# Patient Record
Sex: Female | Born: 1955 | Race: White | Hispanic: No | Smoking: Never smoker
Health system: Southern US, Community
[De-identification: ages and names within clinical notes are randomized; demographics above are authoritative.]

## PROBLEM LIST (undated history)

## (undated) DIAGNOSIS — E785 Hyperlipidemia, unspecified: Secondary | ICD-10-CM

## (undated) DIAGNOSIS — M810 Age-related osteoporosis without current pathological fracture: Secondary | ICD-10-CM

## (undated) DIAGNOSIS — M1990 Unspecified osteoarthritis, unspecified site: Secondary | ICD-10-CM

---

## 1998-05-12 ENCOUNTER — Other Ambulatory Visit: Admission: RE | Admit: 1998-05-12 | Discharge: 1998-05-12 | Payer: Self-pay | Admitting: *Deleted

## 2003-03-06 ENCOUNTER — Encounter: Admission: RE | Admit: 2003-03-06 | Discharge: 2003-03-06 | Payer: Self-pay | Admitting: Family Medicine

## 2003-12-04 ENCOUNTER — Other Ambulatory Visit: Admission: RE | Admit: 2003-12-04 | Discharge: 2003-12-04 | Payer: Self-pay | Admitting: Obstetrics and Gynecology

## 2005-03-16 ENCOUNTER — Other Ambulatory Visit: Admission: RE | Admit: 2005-03-16 | Discharge: 2005-03-16 | Payer: Self-pay | Admitting: Obstetrics and Gynecology

## 2008-01-08 ENCOUNTER — Encounter: Admission: RE | Admit: 2008-01-08 | Discharge: 2008-01-08 | Payer: Self-pay | Admitting: Obstetrics and Gynecology

## 2010-07-26 ENCOUNTER — Other Ambulatory Visit: Payer: Self-pay | Admitting: Obstetrics and Gynecology

## 2010-07-26 DIAGNOSIS — R928 Other abnormal and inconclusive findings on diagnostic imaging of breast: Secondary | ICD-10-CM

## 2010-08-03 ENCOUNTER — Ambulatory Visit
Admission: RE | Admit: 2010-08-03 | Discharge: 2010-08-03 | Disposition: A | Discharge status: Home / Self Care | Payer: BC Managed Care – PPO | Source: Ambulatory Visit | Attending: Obstetrics and Gynecology | Admitting: Obstetrics and Gynecology

## 2010-08-03 DIAGNOSIS — R928 Other abnormal and inconclusive findings on diagnostic imaging of breast: Secondary | ICD-10-CM

## 2014-05-08 ENCOUNTER — Ambulatory Visit (HOSPITAL_COMMUNITY)
Admission: RE | Admit: 2014-05-08 | Discharge: 2014-05-08 | Disposition: A | Discharge status: Home / Self Care | Payer: BC Managed Care – PPO | Source: Ambulatory Visit | Attending: Family Medicine | Admitting: Family Medicine

## 2014-05-08 ENCOUNTER — Other Ambulatory Visit (HOSPITAL_COMMUNITY): Payer: Self-pay | Admitting: Family Medicine

## 2014-05-08 DIAGNOSIS — Z780 Asymptomatic menopausal state: Secondary | ICD-10-CM | POA: Insufficient documentation

## 2014-05-08 DIAGNOSIS — Z8781 Personal history of (healed) traumatic fracture: Secondary | ICD-10-CM

## 2014-05-08 DIAGNOSIS — Z13820 Encounter for screening for osteoporosis: Secondary | ICD-10-CM | POA: Insufficient documentation

## 2014-06-09 ENCOUNTER — Other Ambulatory Visit: Payer: Self-pay | Admitting: Orthopedic Surgery

## 2014-06-10 ENCOUNTER — Encounter (HOSPITAL_COMMUNITY): Payer: Self-pay | Admitting: *Deleted

## 2014-06-10 MED ORDER — CEFAZOLIN SODIUM-DEXTROSE 2-3 GM-% IV SOLR
2.0000 g | INTRAVENOUS | Status: AC
  Administered 2014-06-11: 2 g via INTRAVENOUS
  Filled 2014-06-10: qty 50

## 2014-06-11 ENCOUNTER — Ambulatory Visit (HOSPITAL_COMMUNITY): Payer: BC Managed Care – PPO

## 2014-06-11 ENCOUNTER — Encounter (HOSPITAL_COMMUNITY)
Admission: RE | Disposition: A | Discharge status: Home / Self Care | Payer: Self-pay | Source: Ambulatory Visit | Attending: Orthopedic Surgery

## 2014-06-11 ENCOUNTER — Ambulatory Visit (HOSPITAL_COMMUNITY): Payer: BC Managed Care – PPO | Admitting: Anesthesiology

## 2014-06-11 ENCOUNTER — Ambulatory Visit (HOSPITAL_COMMUNITY)
Admission: RE | Admit: 2014-06-11 | Discharge: 2014-06-11 | Disposition: A | Discharge status: Home / Self Care | Payer: BC Managed Care – PPO | Source: Ambulatory Visit | Attending: Orthopedic Surgery | Admitting: Orthopedic Surgery

## 2014-06-11 ENCOUNTER — Encounter (HOSPITAL_COMMUNITY): Payer: Self-pay | Admitting: *Deleted

## 2014-06-11 DIAGNOSIS — M1990 Unspecified osteoarthritis, unspecified site: Secondary | ICD-10-CM | POA: Diagnosis not present

## 2014-06-11 DIAGNOSIS — M4854XA Collapsed vertebra, not elsewhere classified, thoracic region, initial encounter for fracture: Secondary | ICD-10-CM | POA: Diagnosis not present

## 2014-06-11 DIAGNOSIS — Z419 Encounter for procedure for purposes other than remedying health state, unspecified: Secondary | ICD-10-CM

## 2014-06-11 DIAGNOSIS — Z01818 Encounter for other preprocedural examination: Secondary | ICD-10-CM

## 2014-06-11 HISTORY — DX: Age-related osteoporosis without current pathological fracture: M81.0

## 2014-06-11 HISTORY — PX: KYPHOPLASTY: SHX5884

## 2014-06-11 HISTORY — DX: Unspecified osteoarthritis, unspecified site: M19.90

## 2014-06-11 LAB — CBC WITH DIFFERENTIAL/PLATELET
BASOS ABS: 0.1 10*3/uL (ref 0.0–0.1)
Basophils Relative: 1 % (ref 0–1)
EOS ABS: 0.3 10*3/uL (ref 0.0–0.7)
Eosinophils Relative: 4 % (ref 0–5)
HCT: 40.3 % (ref 36.0–46.0)
Hemoglobin: 14.1 g/dL (ref 12.0–15.0)
Lymphocytes Relative: 20 % (ref 12–46)
Lymphs Abs: 1.2 10*3/uL (ref 0.7–4.0)
MCH: 32.6 pg (ref 26.0–34.0)
MCHC: 35 g/dL (ref 30.0–36.0)
MCV: 93.1 fL (ref 78.0–100.0)
Monocytes Absolute: 0.8 10*3/uL (ref 0.1–1.0)
Monocytes Relative: 13 % — ABNORMAL HIGH (ref 3–12)
NEUTROS PCT: 62 % (ref 43–77)
Neutro Abs: 3.5 10*3/uL (ref 1.7–7.7)
Platelets: 271 10*3/uL (ref 150–400)
RBC: 4.33 MIL/uL (ref 3.87–5.11)
RDW: 11.9 % (ref 11.5–15.5)
WBC: 5.8 10*3/uL (ref 4.0–10.5)

## 2014-06-11 LAB — URINALYSIS, ROUTINE W REFLEX MICROSCOPIC
BILIRUBIN URINE: NEGATIVE
Glucose, UA: NEGATIVE mg/dL
Hgb urine dipstick: NEGATIVE
Ketones, ur: NEGATIVE mg/dL
Nitrite: NEGATIVE
Protein, ur: NEGATIVE mg/dL
SPECIFIC GRAVITY, URINE: 1.022 (ref 1.005–1.030)
UROBILINOGEN UA: 0.2 mg/dL (ref 0.0–1.0)
pH: 5 (ref 5.0–8.0)

## 2014-06-11 LAB — COMPREHENSIVE METABOLIC PANEL
ALT: 15 U/L (ref 14–54)
ANION GAP: 11 (ref 5–15)
AST: 19 U/L (ref 15–41)
Albumin: 3.9 g/dL (ref 3.5–5.0)
Alkaline Phosphatase: 68 U/L (ref 38–126)
BUN: 14 mg/dL (ref 6–20)
CHLORIDE: 101 mmol/L (ref 101–111)
CO2: 24 mmol/L (ref 22–32)
Calcium: 9.4 mg/dL (ref 8.9–10.3)
Creatinine, Ser: 0.61 mg/dL (ref 0.44–1.00)
GLUCOSE: 97 mg/dL (ref 70–99)
POTASSIUM: 4.2 mmol/L (ref 3.5–5.1)
SODIUM: 136 mmol/L (ref 135–145)
Total Bilirubin: 1 mg/dL (ref 0.3–1.2)
Total Protein: 6.8 g/dL (ref 6.5–8.1)

## 2014-06-11 LAB — URINE MICROSCOPIC-ADD ON

## 2014-06-11 LAB — PROTIME-INR
INR: 1 (ref 0.00–1.49)
PROTHROMBIN TIME: 13.3 s (ref 11.6–15.2)

## 2014-06-11 LAB — SURGICAL PCR SCREEN
MRSA, PCR: NEGATIVE
STAPHYLOCOCCUS AUREUS: NEGATIVE

## 2014-06-11 LAB — APTT: aPTT: 29 seconds (ref 24–37)

## 2014-06-11 SURGERY — KYPHOPLASTY
Anesthesia: General

## 2014-06-11 MED ORDER — IOHEXOL 300 MG/ML  SOLN
INTRAMUSCULAR | Status: DC | PRN
  Administered 2014-06-11: 10 mL

## 2014-06-11 MED ORDER — PHENYLEPHRINE 40 MCG/ML (10ML) SYRINGE FOR IV PUSH (FOR BLOOD PRESSURE SUPPORT)
PREFILLED_SYRINGE | INTRAVENOUS | Status: AC
  Filled 2014-06-11: qty 10

## 2014-06-11 MED ORDER — BUPIVACAINE-EPINEPHRINE 0.25% -1:200000 IJ SOLN: INTRAMUSCULAR | Status: DC | PRN

## 2014-06-11 MED ORDER — BACITRACIN 500 UNIT/GM EX OINT
TOPICAL_OINTMENT | CUTANEOUS | Status: DC | PRN
  Administered 2014-06-11: 1 via TOPICAL

## 2014-06-11 MED ORDER — PROPOFOL 10 MG/ML IV BOLUS
INTRAVENOUS | Status: DC | PRN
  Administered 2014-06-11: 140 mg via INTRAVENOUS

## 2014-06-11 MED ORDER — HYDROMORPHONE HCL 1 MG/ML IJ SOLN
0.2500 mg | INTRAMUSCULAR | Status: DC | PRN
  Administered 2014-06-11 (×4): 0.5 mg via INTRAVENOUS

## 2014-06-11 MED ORDER — ARTIFICIAL TEARS OP OINT
TOPICAL_OINTMENT | OPHTHALMIC | Status: DC | PRN
  Administered 2014-06-11: 1 via OPHTHALMIC

## 2014-06-11 MED ORDER — LIDOCAINE HCL (CARDIAC) 20 MG/ML IV SOLN
INTRAVENOUS | Status: DC | PRN
  Administered 2014-06-11: 100 mg via INTRAVENOUS

## 2014-06-11 MED ORDER — BUPIVACAINE-EPINEPHRINE (PF) 0.25% -1:200000 IJ SOLN
INTRAMUSCULAR | Status: AC
  Filled 2014-06-11: qty 30

## 2014-06-11 MED ORDER — NEOSTIGMINE METHYLSULFATE 10 MG/10ML IV SOLN
INTRAVENOUS | Status: AC
  Filled 2014-06-11: qty 1

## 2014-06-11 MED ORDER — HYDROMORPHONE HCL 1 MG/ML IJ SOLN
INTRAMUSCULAR | Status: AC
  Filled 2014-06-11: qty 1

## 2014-06-11 MED ORDER — MUPIROCIN 2 % EX OINT
TOPICAL_OINTMENT | CUTANEOUS | Status: AC
  Administered 2014-06-11: 1
  Filled 2014-06-11: qty 22

## 2014-06-11 MED ORDER — POVIDONE-IODINE 7.5 % EX SOLN
Freq: Once | CUTANEOUS | Status: DC
  Filled 2014-06-11: qty 118

## 2014-06-11 MED ORDER — MIDAZOLAM HCL 5 MG/5ML IJ SOLN
INTRAMUSCULAR | Status: DC | PRN
  Administered 2014-06-11: 2 mg via INTRAVENOUS

## 2014-06-11 MED ORDER — MIDAZOLAM HCL 2 MG/2ML IJ SOLN
INTRAMUSCULAR | Status: AC
  Filled 2014-06-11: qty 2

## 2014-06-11 MED ORDER — ONDANSETRON HCL 4 MG/2ML IJ SOLN
INTRAMUSCULAR | Status: AC
  Administered 2014-06-11: 4 mg via INTRAVENOUS
  Filled 2014-06-11: qty 2

## 2014-06-11 MED ORDER — PHENYLEPHRINE HCL 10 MG/ML IJ SOLN
INTRAMUSCULAR | Status: DC | PRN
  Administered 2014-06-11 (×3): 80 ug via INTRAVENOUS
  Administered 2014-06-11: 160 ug via INTRAVENOUS

## 2014-06-11 MED ORDER — LIDOCAINE HCL (CARDIAC) 20 MG/ML IV SOLN
INTRAVENOUS | Status: AC
  Filled 2014-06-11: qty 5

## 2014-06-11 MED ORDER — PROPOFOL 10 MG/ML IV BOLUS
INTRAVENOUS | Status: AC
  Filled 2014-06-11: qty 20

## 2014-06-11 MED ORDER — GLYCOPYRROLATE 0.2 MG/ML IJ SOLN
INTRAMUSCULAR | Status: AC
  Filled 2014-06-11: qty 2

## 2014-06-11 MED ORDER — ONDANSETRON HCL 4 MG/2ML IJ SOLN
INTRAMUSCULAR | Status: DC | PRN
  Administered 2014-06-11: 4 mg via INTRAVENOUS

## 2014-06-11 MED ORDER — ONDANSETRON HCL 4 MG/2ML IJ SOLN
INTRAMUSCULAR | Status: AC
  Filled 2014-06-11: qty 2

## 2014-06-11 MED ORDER — ONDANSETRON HCL 4 MG/2ML IJ SOLN
4.0000 mg | Freq: Once | INTRAMUSCULAR | Status: AC | PRN
  Administered 2014-06-11: 4 mg via INTRAVENOUS

## 2014-06-11 MED ORDER — NEOSTIGMINE METHYLSULFATE 10 MG/10ML IV SOLN
INTRAVENOUS | Status: DC | PRN
  Administered 2014-06-11: 3 mg via INTRAVENOUS

## 2014-06-11 MED ORDER — FENTANYL CITRATE (PF) 100 MCG/2ML IJ SOLN
INTRAMUSCULAR | Status: DC | PRN
  Administered 2014-06-11: 50 ug via INTRAVENOUS
  Administered 2014-06-11: 150 ug via INTRAVENOUS
  Administered 2014-06-11: 50 ug via INTRAVENOUS

## 2014-06-11 MED ORDER — ROCURONIUM BROMIDE 100 MG/10ML IV SOLN
INTRAVENOUS | Status: DC | PRN
  Administered 2014-06-11: 50 mg via INTRAVENOUS

## 2014-06-11 MED ORDER — FENTANYL CITRATE (PF) 250 MCG/5ML IJ SOLN
INTRAMUSCULAR | Status: AC
  Filled 2014-06-11: qty 5

## 2014-06-11 MED ORDER — GLYCOPYRROLATE 0.2 MG/ML IJ SOLN
INTRAMUSCULAR | Status: DC | PRN
  Administered 2014-06-11: 0.4 mg via INTRAVENOUS

## 2014-06-11 MED ORDER — HYDROMORPHONE HCL 1 MG/ML IJ SOLN
0.5000 mg | INTRAMUSCULAR | Status: DC | PRN
  Administered 2014-06-11: 0.5 mg via INTRAVENOUS

## 2014-06-11 MED ORDER — TRAMADOL HCL 50 MG PO TABS
ORAL_TABLET | ORAL | Status: AC
  Administered 2014-06-11: 50 mg via ORAL
  Filled 2014-06-11: qty 1

## 2014-06-11 MED ORDER — TRAMADOL HCL 50 MG PO TABS
50.0000 mg | ORAL_TABLET | Freq: Once | ORAL | Status: AC
  Administered 2014-06-11: 50 mg via ORAL

## 2014-06-11 MED ORDER — METHOCARBAMOL 500 MG PO TABS
ORAL_TABLET | ORAL | Status: AC
  Administered 2014-06-11: 500 mg via ORAL
  Filled 2014-06-11: qty 1

## 2014-06-11 MED ORDER — METHOCARBAMOL 500 MG PO TABS
500.0000 mg | ORAL_TABLET | Freq: Once | ORAL | Status: AC
  Administered 2014-06-11: 500 mg via ORAL

## 2014-06-11 MED ORDER — MEPERIDINE HCL 25 MG/ML IJ SOLN: 6.2500 mg | INTRAMUSCULAR | Status: DC | PRN

## 2014-06-11 MED ORDER — LACTATED RINGERS IV SOLN
INTRAVENOUS | Status: DC
Start: 2014-06-11 — End: 2014-06-11
  Administered 2014-06-11 (×2): via INTRAVENOUS

## 2014-06-11 MED ORDER — BACITRACIN ZINC 500 UNIT/GM EX OINT
TOPICAL_OINTMENT | CUTANEOUS | Status: AC
  Filled 2014-06-11: qty 28.35

## 2014-06-11 SURGICAL SUPPLY — 53 items
ADH SKN CLS APL DERMABOND .7 (GAUZE/BANDAGES/DRESSINGS)
BANDAGE ADH SHEER 1  50/CT (GAUZE/BANDAGES/DRESSINGS) ×6 IMPLANT
BLADE SURG 15 STRL LF DISP TIS (BLADE) ×1 IMPLANT
BLADE SURG 15 STRL SS (BLADE) ×3
CEMENT BONE KYPHX HV R (Orthopedic Implant) ×2 IMPLANT
CEMENT KYPHON C01A KIT/MIXER (Cement) IMPLANT
COVER MAYO STAND STRL (DRAPES) ×3 IMPLANT
COVER SURGICAL LIGHT HANDLE (MISCELLANEOUS) ×3 IMPLANT
CURETTE WEDGE 8.5MM KYPHX (MISCELLANEOUS) IMPLANT
DERMABOND ADVANCED (GAUZE/BANDAGES/DRESSINGS)
DERMABOND ADVANCED .7 DNX12 (GAUZE/BANDAGES/DRESSINGS) IMPLANT
DRAPE C-ARM 42X72 X-RAY (DRAPES) ×3 IMPLANT
DRAPE INCISE IOBAN 66X45 STRL (DRAPES) ×3 IMPLANT
DRAPE LAPAROTOMY T 102X78X121 (DRAPES) ×3 IMPLANT
DRAPE PROXIMA HALF (DRAPES) ×4 IMPLANT
DRAPE SURG 17X23 STRL (DRAPES) ×12 IMPLANT
DURAPREP 26ML APPLICATOR (WOUND CARE) ×3 IMPLANT
GAUZE SPONGE 2X2 8PLY STRL LF (GAUZE/BANDAGES/DRESSINGS) IMPLANT
GAUZE SPONGE 4X4 16PLY XRAY LF (GAUZE/BANDAGES/DRESSINGS) ×3 IMPLANT
GLOVE BIO SURGEON STRL SZ7 (GLOVE) ×3 IMPLANT
GLOVE BIO SURGEON STRL SZ8 (GLOVE) ×3 IMPLANT
GLOVE BIOGEL M 6.5 STRL (GLOVE) ×2 IMPLANT
GLOVE BIOGEL PI IND STRL 6.5 (GLOVE) IMPLANT
GLOVE BIOGEL PI IND STRL 7.0 (GLOVE) ×1 IMPLANT
GLOVE BIOGEL PI IND STRL 8 (GLOVE) ×1 IMPLANT
GLOVE BIOGEL PI INDICATOR 6.5 (GLOVE) ×2
GLOVE BIOGEL PI INDICATOR 7.0 (GLOVE) ×2
GLOVE BIOGEL PI INDICATOR 8 (GLOVE) ×2
GOWN STRL REUS W/ TWL LRG LVL3 (GOWN DISPOSABLE) ×2 IMPLANT
GOWN STRL REUS W/ TWL XL LVL3 (GOWN DISPOSABLE) ×1 IMPLANT
GOWN STRL REUS W/TWL LRG LVL3 (GOWN DISPOSABLE) ×6
GOWN STRL REUS W/TWL XL LVL3 (GOWN DISPOSABLE) ×3
KIT BASIN OR (CUSTOM PROCEDURE TRAY) ×3 IMPLANT
KIT ROOM TURNOVER OR (KITS) ×3 IMPLANT
NDL HYPO 25GX1X1/2 BEV (NEEDLE) IMPLANT
NDL HYPO 25X1 1.5 SAFETY (NEEDLE) IMPLANT
NDL SPNL 18GX3.5 QUINCKE PK (NEEDLE) ×2 IMPLANT
NEEDLE 22X1 1/2 (OR ONLY) (NEEDLE) ×2 IMPLANT
NEEDLE HYPO 25GX1X1/2 BEV (NEEDLE) ×3 IMPLANT
NEEDLE HYPO 25X1 1.5 SAFETY (NEEDLE) IMPLANT
NEEDLE SPNL 18GX3.5 QUINCKE PK (NEEDLE) ×6 IMPLANT
NS IRRIG 1000ML POUR BTL (IV SOLUTION) ×3 IMPLANT
PACK SURGICAL SETUP 50X90 (CUSTOM PROCEDURE TRAY) ×3 IMPLANT
PAD ARMBOARD 7.5X6 YLW CONV (MISCELLANEOUS) ×6 IMPLANT
POSITIONER HEAD PRONE TRACH (MISCELLANEOUS) ×3 IMPLANT
SPONGE GAUZE 2X2 STER 10/PKG (GAUZE/BANDAGES/DRESSINGS) ×2
SUT MNCRL AB 4-0 PS2 18 (SUTURE) ×3 IMPLANT
SYR BULB IRRIGATION 50ML (SYRINGE) ×3 IMPLANT
SYR CONTROL 10ML LL (SYRINGE) ×5 IMPLANT
TAPE CLOTH SOFT 2X10 (GAUZE/BANDAGES/DRESSINGS) ×2 IMPLANT
TOWEL OR 17X24 6PK STRL BLUE (TOWEL DISPOSABLE) ×3 IMPLANT
TOWEL OR 17X26 10 PK STRL BLUE (TOWEL DISPOSABLE) ×3 IMPLANT
TRAY KYPHOPAK 15/3 ONESTEP 1ST (MISCELLANEOUS) ×2 IMPLANT

## 2014-06-11 NOTE — Anesthesia Postprocedure Evaluation (Signed)
Anesthesia Post Note  Patient: Susan MaclachlanJoy Pino  Procedure(s) Performed: Procedure(s) (LRB): KYPHOPLASTY (N/A)  Anesthesia type: general  Patient location: PACU  Post pain: Pain level controlled  Post assessment: Patient's Cardiovascular Status Stable  Last Vitals:  Filed Vitals:   06/11/14 1650  BP: 128/70  Pulse: 58  Temp: 36.1 C  Resp: 12    Post vital signs: Reviewed and stable  Level of consciousness: sedated  Complications: No apparent anesthesia complications

## 2014-06-11 NOTE — Anesthesia Preprocedure Evaluation (Signed)
Anesthesia Evaluation  Patient identified by MRN, date of birth, ID band Patient awake    Reviewed: Allergy & Precautions, NPO status , Patient's Chart, lab work & pertinent test results  Airway Mallampati: II  TM Distance: >3 FB Neck ROM: Full  Mouth opening: Limited Mouth Opening  Dental   Pulmonary    Pulmonary exam normal       Cardiovascular Normal cardiovascular exam    Neuro/Psych    GI/Hepatic   Endo/Other    Renal/GU      Musculoskeletal   Abdominal   Peds  Hematology   Anesthesia Other Findings   Reproductive/Obstetrics                             Anesthesia Physical Anesthesia Plan  ASA: II  Anesthesia Plan: General   Post-op Pain Management:    Induction: Intravenous  Airway Management Planned: Oral ETT  Additional Equipment:   Intra-op Plan:   Post-operative Plan: Extubation in OR  Informed Consent: I have reviewed the patients History and Physical, chart, labs and discussed the procedure including the risks, benefits and alternatives for the proposed anesthesia with the patient or authorized representative who has indicated his/her understanding and acceptance.     Plan Discussed with: CRNA and Surgeon  Anesthesia Plan Comments:         Anesthesia Quick Evaluation

## 2014-06-11 NOTE — Transfer of Care (Signed)
Immediate Anesthesia Transfer of Care Note  Patient: Susan MaclachlanJoy Kniskern  Procedure(s) Performed: Procedure(s) with comments: KYPHOPLASTY (N/A) - T10 kyphoplasty  Patient Location: PACU  Anesthesia Type:General  Level of Consciousness: awake, alert , oriented and patient cooperative  Airway & Oxygen Therapy: Patient Spontanous Breathing and Patient connected to nasal cannula oxygen  Post-op Assessment: Report given to RN, Post -op Vital signs reviewed and stable and Patient moving all extremities  Post vital signs: Reviewed and stable  Last Vitals:  Filed Vitals:   06/11/14 0939  BP: 157/74  Pulse: 72  Temp: 37.1 C  Resp: 18    Complications: No apparent anesthesia complications

## 2014-06-11 NOTE — Anesthesia Procedure Notes (Signed)
Procedure Name: Intubation Date/Time: 06/11/2014 12:20 PM Performed by: Jerilee HohMUMM, Cru Kritikos N Pre-anesthesia Checklist: Patient identified, Emergency Drugs available, Suction available and Patient being monitored Patient Re-evaluated:Patient Re-evaluated prior to inductionOxygen Delivery Method: Circle system utilized Preoxygenation: Pre-oxygenation with 100% oxygen Intubation Type: IV induction Ventilation: Mask ventilation without difficulty Laryngoscope Size: Mac and 3 Grade View: Grade II Tube type: Oral Tube size: 7.5 mm Number of attempts: 1 Airway Equipment and Method: Stylet Placement Confirmation: ETT inserted through vocal cords under direct vision,  positive ETCO2 and breath sounds checked- equal and bilateral Secured at: 20 cm Tube secured with: Tape Dental Injury: Teeth and Oropharynx as per pre-operative assessment

## 2014-06-11 NOTE — H&P (Signed)
     PREOPERATIVE H&P  Chief Complaint: mid back pain  HPI: Susan Case is a [age] y.o. female who presents with ongoing pain in the mid back  MRI reveals subacute compression fracture at T10  Patient has failed multiple forms of conservative care and continues to have pain (see office notes for additional details regarding the patient's full course of treatment)  Past Medical History  Diagnosis Date  . Arthritis   . Osteoporosis    Past Surgical History  Procedure Laterality Date  . No past surgeries     History   Social History  . Marital Status: Divorced    Spouse Name: N/A  . Number of Children: N/A  . Years of Education: N/A   Social History Main Topics  . Smoking status: Never Smoker   . Smokeless tobacco: Not on file  . Alcohol Use: 0.6 oz/week    1 Glasses of wine per week  . Drug Use: No  . Sexual Activity: Not on file   Other Topics Concern  . None   Social History Narrative  . None   History reviewed. No pertinent family history. No Known Allergies Prior to Admission medications   Medication Sig Start Date End Date Taking? Authorizing Provider  calcitonin, salmon, (MIACALCIN/FORTICAL) 200 UNIT/ACT nasal spray Place 1 spray into alternate nostrils daily.   Yes Historical Provider, MD  citalopram (CELEXA) 40 MG tablet Take 40 mg by mouth daily.   Yes Historical Provider, MD  methocarbamol (ROBAXIN) 500 MG tablet Take 500 mg by mouth 2 (two) times daily.   Yes Historical Provider, MD  traMADol (ULTRAM) 50 MG tablet Take 50 mg by mouth every 8 (eight) hours as needed for moderate pain.   Yes Historical Provider, MD     All other systems have been reviewed and were otherwise negative with the exception of those mentioned in the HPI and as above.  Physical Exam: There were no vitals filed for this visit.  General: Alert, no acute distress Cardiovascular: No pedal edema Respiratory: No cyanosis, no use of accessory musculature Skin: No lesions in the  area of chief complaint Neurologic: Sensation intact distally Psychiatric: Patient is competent for consent with normal mood and affect Lymphatic: No axillary or cervical lymphadenopathy  MUSCULOSKELETAL: + TTP mid back  Assessment/Plan: T10 compression fracture Plan for Procedure(s): KYPHOPLASTY T10   Emilee HeroUMONSKI,Kameryn Tisdel LEONARD, MD 06/11/2014 8:23 AM

## 2014-06-12 ENCOUNTER — Encounter (HOSPITAL_COMMUNITY): Payer: Self-pay | Admitting: Orthopedic Surgery

## 2014-06-12 NOTE — Op Note (Signed)
NAMLandry Dyke:  Spink, Shalva                  ACCOUNT NO.:  [id number]  MEDICAL RECORD NO.:  [id number]  LOCATION:                                FACILITY:  [facility]  PHYSICIAN:  Estill BambergMark Criss Pallone, MD      DATE OF BIRTH:  1955-09-04  DATE OF PROCEDURE:  06/11/2014 DATE OF DISCHARGE:  06/11/2014                              OPERATIVE REPORT   PREOPERATIVE DIAGNOSIS:  T10 compression fracture.  POSTOPERATIVE DIAGNOSIS:  T10 compression fracture.  PROCEDURE:  T10 kyphoplasty.  SURGEON:  Estill BambergMark Krithika Tome, MD  ASSISTANT:  None.  ANESTHESIA:  General endotracheal anesthesia.  COMPLICATIONS:  None.  DISPOSITION:  Stable.  ESTIMATED BLOOD LOSS:  Minimal.  INDICATIONS FOR SURGERY:  Briefly, Ms. Susan Case is a very pleasant 59 year old female, who did present to me with ongoing pain in her mid back.  Of note, she did have a substantial injury at approximately in the middle of February.  She has been having ongoing pain in the mid back ever since the injury.  I did obtain an MRI, which did reveal a subacute compression fracture at T10.  The patient did also have old healed compression fractures at other levels, but it did appear that the T10 level was the level of the active compression fracture and very likely responsible for her pain.  We therefore did discuss proceeding with a T10 kyphoplasty.  The patient did fully understand the risks and limitations of the procedure as outlined in my preoperative note.  OPERATIVE DETAILS:  On Jun 11, 2014, the patient was brought to Surgery and general endotracheal anesthesia was administered.  The patient was placed prone on a well-padded flat Jackson bed with gel rolls placed under the patient's chest and hips.  Antibiotics were given.  The back was prepped and draped and a time-out procedure was performed.  I then made 2 stab incisions just lateral to the T10 pedicles bilaterally.  I then introduced Jamshidi needles across the T10 pedicles on the right and on  the left sides.  I then drilled into the vertebral bodies, and I inserted kyphoplasty balloons bilaterally.  Each balloon was filled with approximately 2 mL of contrast.  I was able to partially restore the superior end-plate.  I then introduced approximately 3 mL of bone cement through each cannula, for a total of approximately 6 mL of cement.  I did note excellent interdigitation of the cement into the vertebral body.  There was no abnormal extravasation of the cement into the spinal canal, or into the retroperitoneal space.  I was very pleased with the final AP and lateral fluoroscopic images.  The cement was then allowed to harden.  The wound was then copiously irrigated.  The incisions were then closed with 4-0 Monocryl.  Bacitracin and a sterile dressing was applied and the patient was then awoken from general endotracheal anesthesia and transferred to recovery in stable condition.     Estill BambergMark Annaleigh Steinmeyer, MD     MD/MEDQ  D:  06/11/2014  T:  06/12/2014  Job:  [id number]

## 2017-09-08 ENCOUNTER — Other Ambulatory Visit: Payer: Self-pay | Admitting: Orthopedic Surgery

## 2017-09-08 DIAGNOSIS — M546 Pain in thoracic spine: Secondary | ICD-10-CM

## 2017-09-09 ENCOUNTER — Ambulatory Visit
Admission: RE | Admit: 2017-09-09 | Discharge: 2017-09-09 | Disposition: A | Discharge status: Home / Self Care | Payer: BC Managed Care – PPO | Source: Ambulatory Visit | Attending: Orthopedic Surgery | Admitting: Orthopedic Surgery

## 2017-09-09 DIAGNOSIS — M546 Pain in thoracic spine: Secondary | ICD-10-CM

## 2017-09-28 ENCOUNTER — Other Ambulatory Visit: Payer: Self-pay | Admitting: Orthopedic Surgery

## 2017-09-28 NOTE — Pre-Procedure Instructions (Signed)
Susan Case  09/28/2017      [facility] - [place] ([place]), [place] - [address] [place] [address] Phone: [phone] Fax: [phone]    Your procedure is scheduled on Thursday September 5th.  [facility] at 1000 A.M.  Call this number if you have problems the morning of surgery:  [phone]   Remember:  Do not eat or drink after midnight.      Take these medicines the morning of surgery with A SIP OF WATER   Tylenol, Tizandidine (Zanaflex)   7 days prior to surgery STOP taking any Aspirin(unless otherwise instructed by your surgeon), Aleve, Naproxen, Ibuprofen, Motrin, Advil, Goody's, BC's, all herbal medications, fish oil, and all vitamins     Do not wear jewelry, make-up or nail polish.  Do not wear lotions, powders, or perfumes, or deodorant.  Do not shave 48 hours prior to surgery.  Men may shave face and neck.  Do not bring valuables to the hospital.  [facility] is not responsible for any belongings or valuables.  Contacts, dentures or bridgework may not be worn into surgery.  Leave your suitcase in the car.  After surgery it may be brought to your room.  For patients admitted to the hospital, discharge time will be determined by your treatment team.  Patients discharged the day of surgery will not be allowed to drive home.    [facility]- Preparing For Surgery  Before surgery, you can play an important role. Because skin is not sterile, your skin needs to be as free of germs as possible. You can reduce the number of germs on your skin by washing with CHG (chlorahexidine gluconate) Soap before surgery.  CHG is an antiseptic cleaner which kills germs and bonds with the skin to continue killing germs even after washing.    Oral Hygiene is also important to reduce your risk of infection.  Remember - BRUSH YOUR TEETH THE MORNING OF SURGERY WITH YOUR REGULAR TOOTHPASTE  Please do not use  if you have an allergy to CHG or antibacterial soaps. If your skin becomes reddened/irritated stop using the CHG.  Do not shave (including legs and underarms) for at least 48 hours prior to first CHG shower. It is OK to shave your face.  Please follow these instructions carefully.   1. Shower the NIGHT BEFORE SURGERY and the MORNING OF SURGERY with CHG.   2. If you chose to wash your hair, wash your hair first as usual with your normal shampoo.  3. After you shampoo, rinse your hair and body thoroughly to remove the shampoo.  4. Use CHG as you would any other liquid soap. You can apply CHG directly to the skin and wash gently with a scrungie or a clean washcloth.   5. Apply the CHG Soap to your body ONLY FROM THE NECK DOWN.  Do not use on open wounds or open sores. Avoid contact with your eyes, ears, mouth and genitals (private parts). Wash Face and genitals (private parts)  with your normal soap.  6. Wash thoroughly, paying special attention to the area where your surgery will be performed.  7. Thoroughly rinse your body with warm water from the neck down.  8. DO NOT shower/wash with your normal soap after using and rinsing off the CHG Soap.  9. Pat yourself dry with a CLEAN TOWEL.  10. Wear CLEAN PAJAMAS to bed the night before surgery, wear  comfortable clothes the morning of surgery  11. Place CLEAN SHEETS on your bed the night of your first shower and DO NOT SLEEP WITH PETS.    Day of Surgery:  Do not apply any deodorants/lotions.  Please wear clean clothes to the hospital/surgery center.   Remember to brush your teeth WITH YOUR REGULAR TOOTHPASTE.    Please read over the following fact sheets that you were given. Coughing and Deep Breathing, MRSA Information and Surgical Site Infection Prevention

## 2017-09-28 NOTE — Pre-Procedure Instructions (Addendum)
Susan Case  09/28/2017     Your procedure is scheduled on Thursday September 5th.  Report to [facility] Admitting at 9:00 A.M.   Call this number if you have problems the morning of surgery:  [phone]  This is the number for the Pre- Surgical Desk.   Remember:  Do not eat or drink after midnight Wednesday, September 4.      Take these medicines the morning of surgery with A SIP OF WATER   Tylenol, Tizandidine (Zanaflex)   7 days prior to surgery STOP taking any Aspirin(unless otherwise instructed by your surgeon), Aleve, Naproxen, Ibuprofen, Motrin, Advil, Goody's, BC's, all herbal medications, fish oil, and all vitamins     Do not wear jewelry, make-up or nail polish.  Do not wear lotions, powders, or perfumes, or deodorant.  Do not shave 48 hours prior to surgery.  Men may shave face and neck.  Do not bring valuables to the hospital.  [facility] is not responsible for any belongings or valuables.  Contacts, dentures or bridgework may not be worn into surgery.  Leave your suitcase in the car.  After surgery it may be brought to your room.  For patients admitted to the hospital, discharge time will be determined by your treatment team.  Patients discharged the day of surgery will not be allowed to drive home.    [name]- Preparing For Surgery  Before surgery, you can play an important role. Because skin is not sterile, your skin needs to be as free of germs as possible. You can reduce the number of germs on your skin by washing with CHG (chlorahexidine gluconate) Soap before surgery.  CHG is an antiseptic cleaner which kills germs and bonds with the skin to continue killing germs even after washing.    Oral Hygiene is also important to reduce your risk of infection.  Remember - BRUSH YOUR TEETH THE MORNING OF SURGERY WITH YOUR REGULAR TOOTHPASTE  Please do not use if you have an allergy to CHG or antibacterial soaps. If your skin becomes  reddened/irritated stop using the CHG.  Do not shave (including legs and underarms) for at least 48 hours prior to first CHG shower. It is OK to shave your face.  Please follow these instructions carefully.   1. Shower the NIGHT BEFORE SURGERY and the MORNING OF SURGERY with CHG.   2. If you chose to wash your hair, wash your hair first as usual with your normal shampoo.  3. After you shampoo, rinse your hair and body thoroughly to remove the shampoo. Wash your face and private area with the soap you use at home, then rinse.   4. Use CHG as you would any other liquid soap. You can apply CHG directly to the skin and wash gently with a scrungie or a clean washcloth.   3. Apply the CHG Soap to your body ONLY FROM THE NECK DOWN.  Do not use on open wounds or open sores. Avoid contact with your eyes, ears, mouth and genitals (private parts).   4. Wash thoroughly, paying special attention to the area where your surgery will be performed.  5. Thoroughly rinse your body with warm water from the neck down.  6. DO NOT shower/wash with your normal soap after using and rinsing off the CHG Soap.  7. Pat yourself dry with a CLEAN TOWEL.  8. Wear CLEAN PAJAMAS to bed the night before surgery, wear comfortable clothes the morning of surgery  9. Place CLEAN SHEETS on your bed the night of your first shower and DO NOT SLEEP WITH PETS.  Day of Surgery: Shower as above Do not apply any deodorants/lotions, powders or colognes..  Please wear clean clothes to the hospital/surgery center.   Remember to brush your teeth WITH YOUR REGULAR TOOTHPASTE.  Do not wear lotions, powders, or perfumes, or deodorant.  Do not shave 48 hours prior to surgery.  Men may shave face and neck.  Do not bring valuables to the hospital.  [facility] is not responsible for any belongings or valuables.  Contacts, dentures or bridgework may not be worn into surgery.  Leave your suitcase in the car.  After surgery it may be  brought to your room.  For patients admitted to the hospital, discharge time will be determined by your treatment team.  Patients discharged the day of surgery will not be allowed to drive home.  Please read over the following fact sheets that you were given. Pain Booklet, Coughing and Deep Breathing Surgical Site Infections.

## 2017-09-29 ENCOUNTER — Other Ambulatory Visit: Payer: Self-pay

## 2017-09-29 ENCOUNTER — Encounter (HOSPITAL_COMMUNITY): Payer: Self-pay

## 2017-09-29 ENCOUNTER — Encounter (HOSPITAL_COMMUNITY)
Admission: RE | Admit: 2017-09-29 | Discharge: 2017-09-29 | Disposition: A | Discharge status: Home / Self Care | Payer: BC Managed Care – PPO | Source: Ambulatory Visit | Attending: Orthopedic Surgery | Admitting: Orthopedic Surgery

## 2017-09-29 DIAGNOSIS — Z01818 Encounter for other preprocedural examination: Secondary | ICD-10-CM | POA: Diagnosis not present

## 2017-09-29 DIAGNOSIS — M4854XA Collapsed vertebra, not elsewhere classified, thoracic region, initial encounter for fracture: Secondary | ICD-10-CM | POA: Insufficient documentation

## 2017-09-29 LAB — COMPREHENSIVE METABOLIC PANEL
ALT: 15 U/L (ref 0–44)
AST: 18 U/L (ref 15–41)
Albumin: 4.4 g/dL (ref 3.5–5.0)
Alkaline Phosphatase: 50 U/L (ref 38–126)
Anion gap: 8 (ref 5–15)
BUN: 10 mg/dL (ref 8–23)
CO2: 25 mmol/L (ref 22–32)
Calcium: 9.3 mg/dL (ref 8.9–10.3)
Chloride: 105 mmol/L (ref 98–111)
Creatinine, Ser: 0.52 mg/dL (ref 0.44–1.00)
GFR calc non Af Amer: >60 mL/min (ref >60)
Glucose, Bld: 100 mg/dL — ABNORMAL HIGH (ref 70–99)
Potassium: 3.9 mmol/L (ref 3.5–5.1)
Sodium: 138 mmol/L (ref 135–145)
Total Bilirubin: 0.8 mg/dL (ref 0.3–1.2)
Total Protein: 7.1 g/dL (ref 6.5–8.1)

## 2017-09-29 LAB — PROTIME-INR
INR: 0.92
Prothrombin Time: 12.3 seconds (ref 11.4–15.2)

## 2017-09-29 LAB — CBC WITH DIFFERENTIAL/PLATELET
ABS IMMATURE GRANULOCYTES: 0 10*3/uL (ref 0.0–0.1)
Basophils Absolute: 0.1 10*3/uL (ref 0.0–0.1)
Basophils Relative: 1 %
Eosinophils Absolute: 0.3 10*3/uL (ref 0.0–0.7)
Eosinophils Relative: 5 %
HCT: 41.6 % (ref 36.0–46.0)
Hemoglobin: 13.9 g/dL (ref 12.0–15.0)
Immature Granulocytes: 0 %
Lymphocytes Relative: 16 %
Lymphs Abs: 0.9 10*3/uL (ref 0.7–4.0)
MCH: 32.3 pg (ref 26.0–34.0)
MCHC: 33.4 g/dL (ref 30.0–36.0)
MCV: 96.7 fL (ref 78.0–100.0)
MONO ABS: 0.5 10*3/uL (ref 0.1–1.0)
Monocytes Relative: 10 %
NEUTROS ABS: 3.7 10*3/uL (ref 1.7–7.7)
Neutrophils Relative %: 68 %
PLATELETS: 278 10*3/uL (ref 150–400)
RBC: 4.3 MIL/uL (ref 3.87–5.11)
RDW: 12 % (ref 11.5–15.5)
WBC: 5.4 10*3/uL (ref 4.0–10.5)

## 2017-09-29 LAB — URINALYSIS, ROUTINE W REFLEX MICROSCOPIC
Bilirubin Urine: NEGATIVE
GLUCOSE, UA: NEGATIVE mg/dL
Hgb urine dipstick: NEGATIVE
Ketones, ur: NEGATIVE mg/dL
Nitrite: NEGATIVE
Protein, ur: NEGATIVE mg/dL
SPECIFIC GRAVITY, URINE: 1.016 (ref 1.005–1.030)
pH: 5 (ref 5.0–8.0)

## 2017-09-29 LAB — TYPE AND SCREEN
ABO/RH(D): A POS
ANTIBODY SCREEN: NEGATIVE

## 2017-09-29 LAB — ABO/RH: ABO/RH(D): A POS

## 2017-09-29 LAB — APTT: APTT: 28 s (ref 24–36)

## 2017-09-29 LAB — SURGICAL PCR SCREEN
MRSA, PCR: NEGATIVE
STAPHYLOCOCCUS AUREUS: NEGATIVE

## 2017-10-03 NOTE — Progress Notes (Signed)
Anesthesia Chart Review:  Case:  [id number] Date/Time:  10/05/17 1145   Procedure:  T8 AND T11 KYPHOPLASTY (N/A )   Anesthesia type:  General   Pre-op diagnosis:  SUBACUTE COMPRESSION FRACTURES INVOLVING THE T8 AND T11 VERTEBRAL BODIES   Location:  [facility] OR ROOM 05 / [facility] OR   Surgeon:  Estill Bamberg, MD      DISCUSSION: Patient is a 62 year old female scheduled for the above procedure. History includes never smoker, osteoporosis, T10 kyphoplasty '16.  If no acute changes then I anticipate that she can proceed as planned.   VS: BP 130/73   Pulse 73   Temp 36.8 C   Resp 18   Ht 5' (1.524 m)   Wt 43.1 kg   SpO2 100%   BMI 18.57 kg/m    PROVIDERS: PCP is listed as Merri Brunette, MD, but last CPE on 08/22/17 was done by Chilton Greathouse, MD at [facility]).    LABS: Labs reviewed: Acceptable for surgery. (all labs ordered are listed, but only abnormal results are displayed)  Labs Reviewed  COMPREHENSIVE METABOLIC PANEL - Abnormal; Notable for the following components:      Result Value   Glucose, Bld 100 (*)    All other components within normal limits  URINALYSIS, ROUTINE W REFLEX MICROSCOPIC - Abnormal; Notable for the following components:   Leukocytes, UA TRACE (*)    Bacteria, UA RARE (*)    All other components within normal limits  SURGICAL PCR SCREEN  APTT  CBC WITH DIFFERENTIAL/PLATELET  PROTIME-INR  TYPE AND SCREEN  ABO/RH     IMAGES: CXR 08/22/17 ([facility]; as part of annual exam):  IMPRESSION: Hyperinflation consistent with COPD.  Prior kyphoplasty in the lower thoracic spine.  Multiple probable osteoporotic compression fractures.  No evidence of superimposed acute process otherwise.   EKG: 08/22/17 ([facility]; as part of annual exam): SR, long QT interval (QT 442/QTc 480 ms). Reviewed by Dr. Marsh Dolly new recommendations noted.    CV: N/A  Past Medical History:  Diagnosis Date  . Arthritis   . Osteoporosis     Past Surgical History:   Procedure Laterality Date  . KYPHOPLASTY N/A 06/11/2014   Procedure: KYPHOPLASTY;  Surgeon: Estill Bamberg, MD;  Location: [facility] OR;  Service: Orthopedics;  Laterality: N/A;  T10 kyphoplasty    MEDICATIONS: . acetaminophen (TYLENOL) 500 MG tablet  . Cholecalciferol (VITAMIN D3) 2000 units TABS  . FORTEO 600 MCG/2.4ML SOLN  . tiZANidine (ZANAFLEX) 2 MG tablet  . traZODone (DESYREL) 50 MG tablet  . vitamin C (ASCORBIC ACID) 500 MG tablet   No current facility-administered medications for this encounter.     Velna Ochs [facility] Short Stay Center/Anesthesiology Phone [phone] 10/03/2017 9:53 AM

## 2017-10-05 ENCOUNTER — Encounter (HOSPITAL_COMMUNITY): Payer: Self-pay | Admitting: *Deleted

## 2017-10-05 ENCOUNTER — Ambulatory Visit (HOSPITAL_COMMUNITY)
Admission: RE | Admit: 2017-10-05 | Discharge: 2017-10-05 | Disposition: A | Discharge status: Home / Self Care | Payer: BC Managed Care – PPO | Source: Ambulatory Visit | Attending: Orthopedic Surgery | Admitting: Orthopedic Surgery

## 2017-10-05 ENCOUNTER — Encounter (HOSPITAL_COMMUNITY)
Admission: RE | Disposition: A | Discharge status: Home / Self Care | Payer: Self-pay | Source: Ambulatory Visit | Attending: Orthopedic Surgery

## 2017-10-05 ENCOUNTER — Other Ambulatory Visit: Payer: Self-pay

## 2017-10-05 ENCOUNTER — Ambulatory Visit (HOSPITAL_COMMUNITY): Payer: BC Managed Care – PPO | Admitting: Anesthesiology

## 2017-10-05 ENCOUNTER — Ambulatory Visit (HOSPITAL_COMMUNITY): Payer: BC Managed Care – PPO

## 2017-10-05 ENCOUNTER — Ambulatory Visit (HOSPITAL_COMMUNITY): Payer: BC Managed Care – PPO | Admitting: Vascular Surgery

## 2017-10-05 DIAGNOSIS — M1990 Unspecified osteoarthritis, unspecified site: Secondary | ICD-10-CM | POA: Insufficient documentation

## 2017-10-05 DIAGNOSIS — Z79899 Other long term (current) drug therapy: Secondary | ICD-10-CM | POA: Diagnosis not present

## 2017-10-05 DIAGNOSIS — M810 Age-related osteoporosis without current pathological fracture: Secondary | ICD-10-CM | POA: Insufficient documentation

## 2017-10-05 DIAGNOSIS — X58XXXA Exposure to other specified factors, initial encounter: Secondary | ICD-10-CM | POA: Insufficient documentation

## 2017-10-05 DIAGNOSIS — Z885 Allergy status to narcotic agent status: Secondary | ICD-10-CM | POA: Insufficient documentation

## 2017-10-05 DIAGNOSIS — S22060A Wedge compression fracture of T7-T8 vertebra, initial encounter for closed fracture: Secondary | ICD-10-CM | POA: Insufficient documentation

## 2017-10-05 DIAGNOSIS — S22080A Wedge compression fracture of T11-T12 vertebra, initial encounter for closed fracture: Secondary | ICD-10-CM | POA: Insufficient documentation

## 2017-10-05 DIAGNOSIS — Z01818 Encounter for other preprocedural examination: Secondary | ICD-10-CM

## 2017-10-05 DIAGNOSIS — Z419 Encounter for procedure for purposes other than remedying health state, unspecified: Secondary | ICD-10-CM

## 2017-10-05 HISTORY — PX: KYPHOPLASTY: SHX5884

## 2017-10-05 SURGERY — KYPHOPLASTY
Anesthesia: General | Site: Back

## 2017-10-05 MED ORDER — DIAZEPAM 5 MG PO TABS: 5.0000 mg | ORAL_TABLET | Freq: Once | ORAL | Status: DC

## 2017-10-05 MED ORDER — LIDOCAINE 2% (20 MG/ML) 5 ML SYRINGE
INTRAMUSCULAR | Status: AC
  Filled 2017-10-05: qty 5

## 2017-10-05 MED ORDER — LABETALOL HCL 5 MG/ML IV SOLN
INTRAVENOUS | Status: DC | PRN
  Administered 2017-10-05: 5 mg via INTRAVENOUS

## 2017-10-05 MED ORDER — METOCLOPRAMIDE HCL 5 MG/ML IJ SOLN: 10.0000 mg | Freq: Once | INTRAMUSCULAR | Status: DC | PRN

## 2017-10-05 MED ORDER — CEFAZOLIN SODIUM-DEXTROSE 2-4 GM/100ML-% IV SOLN
2.0000 g | INTRAVENOUS | Status: AC
  Administered 2017-10-05: 2 g via INTRAVENOUS

## 2017-10-05 MED ORDER — MEPERIDINE HCL 50 MG/ML IJ SOLN: 6.2500 mg | INTRAMUSCULAR | Status: DC | PRN

## 2017-10-05 MED ORDER — LACTATED RINGERS IV SOLN: INTRAVENOUS | Status: DC | PRN

## 2017-10-05 MED ORDER — FENTANYL CITRATE (PF) 250 MCG/5ML IJ SOLN
INTRAMUSCULAR | Status: AC
  Filled 2017-10-05: qty 5

## 2017-10-05 MED ORDER — ROCURONIUM BROMIDE 10 MG/ML (PF) SYRINGE
PREFILLED_SYRINGE | INTRAVENOUS | Status: DC | PRN
  Administered 2017-10-05: 20 mg via INTRAVENOUS
  Administered 2017-10-05: 30 mg via INTRAVENOUS

## 2017-10-05 MED ORDER — MIDAZOLAM HCL 2 MG/2ML IJ SOLN
INTRAMUSCULAR | Status: AC
  Filled 2017-10-05: qty 2

## 2017-10-05 MED ORDER — LACTATED RINGERS IV SOLN
INTRAVENOUS | Status: DC
  Administered 2017-10-05 (×2): via INTRAVENOUS

## 2017-10-05 MED ORDER — FENTANYL CITRATE (PF) 100 MCG/2ML IJ SOLN
INTRAMUSCULAR | Status: DC | PRN
  Administered 2017-10-05: 100 ug via INTRAVENOUS
  Administered 2017-10-05 (×2): 25 ug via INTRAVENOUS
  Administered 2017-10-05: 100 ug via INTRAVENOUS

## 2017-10-05 MED ORDER — IOPAMIDOL (ISOVUE-300) INJECTION 61%
INTRAVENOUS | Status: DC | PRN
  Administered 2017-10-05 (×2): 50 mL

## 2017-10-05 MED ORDER — DEXAMETHASONE SODIUM PHOSPHATE 10 MG/ML IJ SOLN
INTRAMUSCULAR | Status: AC
  Filled 2017-10-05: qty 1

## 2017-10-05 MED ORDER — LIDOCAINE 2% (20 MG/ML) 5 ML SYRINGE
INTRAMUSCULAR | Status: DC | PRN
  Administered 2017-10-05: 60 mg via INTRAVENOUS

## 2017-10-05 MED ORDER — MIDAZOLAM HCL 2 MG/2ML IJ SOLN
INTRAMUSCULAR | Status: DC | PRN
  Administered 2017-10-05: 2 mg via INTRAVENOUS

## 2017-10-05 MED ORDER — SCOPOLAMINE 1 MG/3DAYS TD PT72
MEDICATED_PATCH | TRANSDERMAL | Status: AC
  Filled 2017-10-05: qty 1

## 2017-10-05 MED ORDER — IOPAMIDOL (ISOVUE-300) INJECTION 61%
INTRAVENOUS | Status: AC
  Filled 2017-10-05: qty 50

## 2017-10-05 MED ORDER — POVIDONE-IODINE 10 % EX SWAB: 2.0000 "application " | Freq: Once | CUTANEOUS | Status: DC

## 2017-10-05 MED ORDER — PROPOFOL 10 MG/ML IV BOLUS
INTRAVENOUS | Status: AC
  Filled 2017-10-05: qty 20

## 2017-10-05 MED ORDER — ROCURONIUM BROMIDE 50 MG/5ML IV SOSY
PREFILLED_SYRINGE | INTRAVENOUS | Status: AC
  Filled 2017-10-05: qty 5

## 2017-10-05 MED ORDER — ONDANSETRON HCL 4 MG/2ML IJ SOLN
INTRAMUSCULAR | Status: DC | PRN
  Administered 2017-10-05: 4 mg via INTRAVENOUS

## 2017-10-05 MED ORDER — SUGAMMADEX SODIUM 200 MG/2ML IV SOLN
INTRAVENOUS | Status: DC | PRN
  Administered 2017-10-05: 100 mg via INTRAVENOUS

## 2017-10-05 MED ORDER — CEFAZOLIN SODIUM-DEXTROSE 2-4 GM/100ML-% IV SOLN
INTRAVENOUS | Status: AC
  Filled 2017-10-05: qty 100

## 2017-10-05 MED ORDER — FENTANYL CITRATE (PF) 100 MCG/2ML IJ SOLN
INTRAMUSCULAR | Status: AC
  Filled 2017-10-05: qty 2

## 2017-10-05 MED ORDER — FENTANYL CITRATE (PF) 100 MCG/2ML IJ SOLN
25.0000 ug | INTRAMUSCULAR | Status: DC | PRN
  Administered 2017-10-05 (×2): 50 ug via INTRAVENOUS

## 2017-10-05 MED ORDER — SCOPOLAMINE 1 MG/3DAYS TD PT72
MEDICATED_PATCH | TRANSDERMAL | Status: DC | PRN
  Administered 2017-10-05: 1 via TRANSDERMAL

## 2017-10-05 MED ORDER — ONDANSETRON HCL 4 MG/2ML IJ SOLN
INTRAMUSCULAR | Status: AC
  Filled 2017-10-05: qty 2

## 2017-10-05 MED ORDER — BACITRACIN ZINC 500 UNIT/GM EX OINT
TOPICAL_OINTMENT | CUTANEOUS | Status: AC
  Filled 2017-10-05: qty 28.35

## 2017-10-05 MED ORDER — PHENYLEPHRINE 40 MCG/ML (10ML) SYRINGE FOR IV PUSH (FOR BLOOD PRESSURE SUPPORT)
PREFILLED_SYRINGE | INTRAVENOUS | Status: DC | PRN
  Administered 2017-10-05: 160 ug via INTRAVENOUS

## 2017-10-05 MED ORDER — PROPOFOL 10 MG/ML IV BOLUS
INTRAVENOUS | Status: DC | PRN
  Administered 2017-10-05: 130 mg via INTRAVENOUS

## 2017-10-05 MED ORDER — 0.9 % SODIUM CHLORIDE (POUR BTL) OPTIME
TOPICAL | Status: DC | PRN
  Administered 2017-10-05: 1000 mL

## 2017-10-05 MED ORDER — OXYCODONE-ACETAMINOPHEN 5-325 MG PO TABS: 1.0000 | ORAL_TABLET | Freq: Once | ORAL | Status: DC

## 2017-10-05 SURGICAL SUPPLY — 48 items
BLADE SURG 15 STRL LF DISP TIS (BLADE) ×1 IMPLANT
BLADE SURG 15 STRL SS (BLADE) ×3
CEMENT BONE KYPHX HV R (Orthopedic Implant) ×4 IMPLANT
COVER MAYO STAND STRL (DRAPES) ×3 IMPLANT
COVER SURGICAL LIGHT HANDLE (MISCELLANEOUS) ×3 IMPLANT
CURETTE EXPRESS SZ2 7MM (INSTRUMENTS) IMPLANT
CURRETTE EXPRESS SZ2 7MM (INSTRUMENTS) ×3
DECANTER SPIKE VIAL GLASS SM (MISCELLANEOUS) ×2 IMPLANT
DRAPE C-ARM 42X72 X-RAY (DRAPES) ×3 IMPLANT
DRAPE HALF SHEET 40X57 (DRAPES) IMPLANT
DRAPE INCISE IOBAN 66X45 STRL (DRAPES) ×3 IMPLANT
DRAPE LAPAROTOMY T 102X78X121 (DRAPES) ×3 IMPLANT
DRAPE SURG 17X23 STRL (DRAPES) ×12 IMPLANT
DURAPREP 26ML APPLICATOR (WOUND CARE) ×3 IMPLANT
GAUZE 4X4 16PLY RFD (DISPOSABLE) ×3 IMPLANT
GAUZE SPONGE 2X2 8PLY STRL LF (GAUZE/BANDAGES/DRESSINGS) IMPLANT
GAUZE SPONGE 4X4 16PLY XRAY LF (GAUZE/BANDAGES/DRESSINGS) ×4 IMPLANT
GLOVE BIO SURGEON STRL SZ7 (GLOVE) ×3 IMPLANT
GLOVE BIO SURGEON STRL SZ8 (GLOVE) ×3 IMPLANT
GLOVE BIOGEL PI IND STRL 7.0 (GLOVE) ×1 IMPLANT
GLOVE BIOGEL PI IND STRL 8 (GLOVE) ×1 IMPLANT
GLOVE BIOGEL PI INDICATOR 7.0 (GLOVE) ×2
GLOVE BIOGEL PI INDICATOR 8 (GLOVE) ×2
GOWN STRL REUS W/ TWL LRG LVL3 (GOWN DISPOSABLE) ×2 IMPLANT
GOWN STRL REUS W/ TWL XL LVL3 (GOWN DISPOSABLE) ×1 IMPLANT
GOWN STRL REUS W/TWL LRG LVL3 (GOWN DISPOSABLE) ×6
GOWN STRL REUS W/TWL XL LVL3 (GOWN DISPOSABLE) ×3
KIT BASIN OR (CUSTOM PROCEDURE TRAY) ×3 IMPLANT
KIT TURNOVER KIT B (KITS) ×3 IMPLANT
KYPHOPAK ONE STEP TRAY FIRST FRACTURE ×2 IMPLANT
NDL HYPO 25X1 1.5 SAFETY (NEEDLE) IMPLANT
NDL SPNL 18GX3.5 QUINCKE PK (NEEDLE) ×2 IMPLANT
NEEDLE 22X1 1/2 (OR ONLY) (NEEDLE) IMPLANT
NEEDLE HYPO 25X1 1.5 SAFETY (NEEDLE) IMPLANT
NEEDLE SPNL 18GX3.5 QUINCKE PK (NEEDLE) ×6 IMPLANT
NS IRRIG 1000ML POUR BTL (IV SOLUTION) ×3 IMPLANT
PACK UNIVERSAL I (CUSTOM PROCEDURE TRAY) ×3 IMPLANT
PAD ARMBOARD 7.5X6 YLW CONV (MISCELLANEOUS) ×6 IMPLANT
POSITIONER HEAD PRONE TRACH (MISCELLANEOUS) ×3 IMPLANT
SPONGE GAUZE 2X2 STER 10/PKG (GAUZE/BANDAGES/DRESSINGS) ×4
SUT MNCRL AB 4-0 PS2 18 (SUTURE) ×3 IMPLANT
SYR BULB IRRIGATION 50ML (SYRINGE) ×3 IMPLANT
SYR CONTROL 10ML LL (SYRINGE) ×3 IMPLANT
TAPE CLOTH SURG 4X10 WHT LF (GAUZE/BANDAGES/DRESSINGS) ×4 IMPLANT
TOWEL OR 17X24 6PK STRL BLUE (TOWEL DISPOSABLE) ×3 IMPLANT
TOWEL OR 17X26 10 PK STRL BLUE (TOWEL DISPOSABLE) ×3 IMPLANT
TRAY KYPHOPAK 15/3 ONESTEP 1ST (MISCELLANEOUS) ×4 IMPLANT
TRAY KYPHOPAK 20/3 ONESTEP 1ST (MISCELLANEOUS) IMPLANT

## 2017-10-05 NOTE — Anesthesia Preprocedure Evaluation (Addendum)
Anesthesia Evaluation  Patient identified by MRN, date of birth, ID band Patient awake    Reviewed: Allergy & Precautions, NPO status , Patient's Chart, lab work & pertinent test results  Airway Mallampati: II  TM Distance: >3 FB Neck ROM: Full    Dental  (+) Partial Lower   Pulmonary neg pulmonary ROS,    Pulmonary exam normal breath sounds clear to auscultation       Cardiovascular negative cardio ROS Normal cardiovascular exam Rhythm:Regular Rate:Normal     Neuro/Psych negative neurological ROS  negative psych ROS   GI/Hepatic negative GI ROS, Neg liver ROS,   Endo/Other  Osteoporosis   Renal/GU negative Renal ROS  negative genitourinary   Musculoskeletal  (+) Arthritis , Compression Fx T8, T11 vertebral bodies   Abdominal   Peds  Hematology   Anesthesia Other Findings   Reproductive/Obstetrics                            Anesthesia Physical Anesthesia Plan  ASA: II  Anesthesia Plan: General   Post-op Pain Management:    Induction: Intravenous  PONV Risk Score and Plan: 4 or greater and Scopolamine patch - Pre-op, Midazolam, Ondansetron and Treatment may vary due to age or medical condition  Airway Management Planned: Oral ETT  Additional Equipment:   Intra-op Plan:   Post-operative Plan: Extubation in OR  Informed Consent: I have reviewed the patients History and Physical, chart, labs and discussed the procedure including the risks, benefits and alternatives for the proposed anesthesia with the patient or authorized representative who has indicated his/her understanding and acceptance.   Dental advisory given  Plan Discussed with: Surgeon and CRNA  Anesthesia Plan Comments:         Anesthesia Quick Evaluation

## 2017-10-05 NOTE — Transfer of Care (Signed)
Immediate Anesthesia Transfer of Care Note  Patient: Susan Case  Procedure(s) Performed: T8 AND T11 KYPHOPLASTY (N/A Back)  Patient Location: PACU  Anesthesia Type:General  Level of Consciousness: awake, alert  and oriented  Airway & Oxygen Therapy: Patient Spontanous Breathing and Patient connected to nasal cannula oxygen  Post-op Assessment: Report given to RN, Post -op Vital signs reviewed and stable and Patient moving all extremities  Post vital signs: Reviewed and stable  Last Vitals:  Vitals Value Taken Time  BP 143/79 10/05/2017  1:18 PM  Temp    Pulse 72 10/05/2017  1:19 PM  Resp 16 10/05/2017  1:19 PM  SpO2 93 % 10/05/2017  1:19 PM  Vitals shown include unvalidated device data.  Last Pain:  Vitals:   10/05/17 0803  TempSrc: Oral  PainSc:          Complications: No apparent anesthesia complications

## 2017-10-05 NOTE — Anesthesia Procedure Notes (Addendum)
Procedure Name: Intubation Date/Time: 10/05/2017 11:38 AM Performed by: Leonor Liv, CRNA Pre-anesthesia Checklist: Patient identified, Emergency Drugs available, Suction available and Patient being monitored Patient Re-evaluated:Patient Re-evaluated prior to induction Oxygen Delivery Method: Circle System Utilized Preoxygenation: Pre-oxygenation with 100% oxygen Induction Type: IV induction Ventilation: Mask ventilation without difficulty Laryngoscope Size: Mac and 3 Grade View: Grade II Tube type: Oral Tube size: 7.0 mm Number of attempts: 1 Airway Equipment and Method: Stylet and Oral airway Placement Confirmation: ETT inserted through vocal cords under direct vision,  positive ETCO2 and breath sounds checked- equal and bilateral Secured at: 19 cm Tube secured with: Tape Dental Injury: Teeth and Oropharynx as per pre-operative assessment

## 2017-10-05 NOTE — Op Note (Signed)
NAME:  Susan Case              MEDICAL RECORD NO.:  [id number]  PHYSICIAN:  Estill Bamberg, MD           DATE OF BIRTH: 1955/05/22  DATE OF PROCEDURE:  10/05/2017                              OPERATIVE REPORT   PREOPERATIVE DIAGNOSIS:  T8, T11 compression fractures  POSTOPERATIVE DIAGNOSIS:  T8, T11 compression fractures  PROCEDURE:  T8, T11 kyphoplasty procedures.  SURGEON:  Estill Bamberg, MD.  ASSISTANTJason Coop, PA-C.  ANESTHESIA:  General endotracheal anesthesia.  COMPLICATIONS:  None.  DISPOSITION:  Stable.  ESTIMATED BLOOD LOSS:  Minimal.  INDICATIONS FOR SURGERY:  Briefly, Susan Case is a very pleasant 62- year-old female, who has been having ongoing pain in her back.   Her pain was rather severe.  The patient's imaging studies did clearly reveal compression fractures at the levels noted above.  We did attempt a trial of nonoperative treatment, but the patient continued to feel rather debilitated as a result of her ongoing pain.  Given her ongoing pain and dysfunction, we did discuss proceeding with the procedure noted above.  I did fully discuss the procedure with the patient, and she did wish to proceed.  OPERATIVE DETAILS:  On 10/05/2017, the patient was brought to surgery and general endotracheal anesthesia was administered.  The patient was placed prone on a well-padded flat Jackson bed with gel rolls placed under the patient's chest and hips.  Antibiotics were given.  AP and lateral fluoroscopy was brought into the field.  The T8 and T11 pedicles were marked out in the usual fashion.  After a time-out procedure was performed, I did advance Jamshidis across the T8 and T11 pedicles on the right and left sides.  I then drilled through the Jamshidis.  I then inserted kyphoplasty balloons and I was able to inflate the balloons with approximately 3 cc of contrast at each level. At this point, after the cement was mixed, a total of approximately 3 cc of  cement was injected into each level, half on the right, and half on the left.  Excellent interdigitation of cement was identified. There was no abnormal extravasation of cement identified.  The cement was then allowed to harden, after which point the Jamshidis were removed.  The wound  was then irrigated and closed using 4-0 Monocryl.  Bacitracin and a sterile dressing was applied.  The patient was then awoken from general endotracheal anesthesia and transferred to recovery in stable condition.     Estill Bamberg, MD

## 2017-10-05 NOTE — H&P (Signed)
PREOPERATIVE H&P  Chief Complaint: Back pain  HPI: Susan Case is a 62 y.o. female who presents with ongoing pain in the back  MRI reveals compression fractures at T8 and T11  Patient has failed multiple forms of conservative care and continues to have pain (see office notes for additional details regarding the patient's full course of treatment)  Past Medical History:  Diagnosis Date  . Arthritis   . Osteoporosis    Past Surgical History:  Procedure Laterality Date  . KYPHOPLASTY N/A 06/11/2014   Procedure: KYPHOPLASTY;  Surgeon: Estill Bamberg, MD;  Location: [facility] OR;  Service: Orthopedics;  Laterality: N/A;  T10 kyphoplasty   Social History   Socioeconomic History  . Marital status: Divorced    Spouse name: Not on file  . Number of children: Not on file  . Years of education: Not on file  . Highest education level: Not on file  Occupational History  . Not on file  Social Needs  . Financial resource strain: Not on file  . Food insecurity:    Worry: Not on file    Inability: Not on file  . Transportation needs:    Medical: Not on file    Non-medical: Not on file  Tobacco Use  . Smoking status: Never Smoker  . Smokeless tobacco: Never Used  Substance and Sexual Activity  . Alcohol use: Not Currently  . Drug use: No  . Sexual activity: Not on file  Lifestyle  . Physical activity:    Days per week: Not on file    Minutes per session: Not on file  . Stress: Not on file  Relationships  . Social connections:    Talks on phone: Not on file    Gets together: Not on file    Attends religious service: Not on file    Active member of club or organization: Not on file    Attends meetings of clubs or organizations: Not on file    Relationship status: Not on file  Other Topics Concern  . Not on file  Social History Narrative  . Not on file   No family history on file. Allergies  Allergen Reactions  . Hydrocodone Itching  . Codeine Itching and Nausea Only    Prior to Admission medications   Medication Sig Start Date End Date Taking? Authorizing Provider  acetaminophen (TYLENOL) 500 MG tablet Take 1,000 mg by mouth 2 (two) times daily. In the morning & after supper   Yes [provider]  Cholecalciferol (VITAMIN D3) 2000 units TABS Take 2,000 Units by mouth daily.   Yes [provider]  FORTEO 600 MCG/2.4ML SOLN Inject 20 mcg into the skin at bedtime.   Yes [provider]  tiZANidine (ZANAFLEX) 2 MG tablet Take 2 mg by mouth every morning.  08/04/17  Yes [provider]  traZODone (DESYREL) 50 MG tablet Take 50 mg by mouth at bedtime as needed (sleep).  09/24/17  Yes [provider]  vitamin C (ASCORBIC ACID) 500 MG tablet Take 500 mg by mouth daily.   Yes [provider]     All other systems have been reviewed and were otherwise negative with the exception of those mentioned in the HPI and as above.  Physical Exam: There were no vitals filed for this visit.  There is no height or weight on file to calculate BMI.  General: Alert, no acute distress Cardiovascular: No pedal edema Respiratory: No cyanosis, no use of accessory musculature Skin:  No lesions in the area of chief complaint Neurologic: Sensation intact distally Psychiatric: Patient is competent for consent with normal mood and affect Lymphatic: No axillary or cervical lymphadenopathy   Assessment/Plan: SUBACUTE COMPRESSION FRACTURES INVOLVING THE T8 AND T11 VERTEBRAL BODIES Plan for Procedure(s): T8 AND T11 KYPHOPLASTY   Emilee Hero, MD 10/05/2017 6:31 AM

## 2017-10-06 NOTE — Anesthesia Postprocedure Evaluation (Signed)
Anesthesia Post Note  Patient: Susan Case  Procedure(s) Performed: T8 AND T11 KYPHOPLASTY (N/A Back)     Patient location during evaluation: PACU Anesthesia Type: General Level of consciousness: awake and alert Pain management: pain level controlled Vital Signs Assessment: post-procedure vital signs reviewed and stable Respiratory status: spontaneous breathing, nonlabored ventilation, respiratory function stable and patient connected to nasal cannula oxygen Cardiovascular status: blood pressure returned to baseline and stable Postop Assessment: no apparent nausea or vomiting Anesthetic complications: no    Last Vitals:  Vitals:   10/05/17 1400 10/05/17 1408  BP:  (!) 141/81  Pulse: 69 (!) 56  Resp: 16 13  Temp:    SpO2: 94% 100%    Last Pain:  Vitals:   10/05/17 1408  TempSrc:   PainSc: Asleep                 Maylen Waltermire S

## 2017-10-10 ENCOUNTER — Encounter (HOSPITAL_COMMUNITY): Payer: Self-pay | Admitting: Orthopedic Surgery

## 2019-04-13 ENCOUNTER — Ambulatory Visit: Payer: BC Managed Care – PPO | Attending: Internal Medicine

## 2019-04-13 DIAGNOSIS — Z23 Encounter for immunization: Secondary | ICD-10-CM

## 2019-04-13 NOTE — Progress Notes (Signed)
   Covid-19 Vaccination Clinic  Name:  Susan Case    MRN: [id number] DOB: 08-May-1955  04/13/2019  [name] was observed post Covid-19 immunization for 15 minutes without incident. She was provided with Vaccine Information Sheet and instruction to access the V-Safe system.   [name] was instructed to call 911 with any severe reactions post vaccine: Marland Kitchen Difficulty breathing  . Swelling of face and throat  . A fast heartbeat  . A bad rash all over body  . Dizziness and weakness   Immunizations Administered    Name Date Dose VIS Date Route   Pfizer COVID-19 Vaccine 04/13/2019  8:25 AM 0.3 mL 01/11/2019 Intramuscular   Manufacturer: [employer], [employer]   Lot: [id number]   NDC: [address]-0

## 2019-04-30 ENCOUNTER — Ambulatory Visit: Payer: BC Managed Care – PPO | Attending: Internal Medicine

## 2019-04-30 DIAGNOSIS — Z23 Encounter for immunization: Secondary | ICD-10-CM

## 2019-04-30 NOTE — Progress Notes (Signed)
   Covid-19 Vaccination Clinic  Name:  Susan Case    MRN: [id number] DOB: 1955-04-07  04/30/2019  [name] was observed post Covid-19 immunization for 15 minutes without incident. She was provided with Vaccine Information Sheet and instruction to access the V-Safe system.   [name] was instructed to call 911 with any severe reactions post vaccine: Marland Kitchen Difficulty breathing  . Swelling of face and throat  . A fast heartbeat  . A bad rash all over body  . Dizziness and weakness   Immunizations Administered    Name Date Dose VIS Date Route   Pfizer COVID-19 Vaccine 04/30/2019  8:31 AM 0.3 mL 01/11/2019 Intramuscular   Manufacturer: [employer], [employer]   Lot: [id number]   NDC: [address]-8

## 2019-05-07 ENCOUNTER — Ambulatory Visit: Payer: BC Managed Care – PPO

## 2020-04-20 IMAGING — MR MR THORACIC SPINE W/O CM
4 of 6 series · 18 of 48 positions shown · non-contrast
Comparison: Intraoperative radiographs 06/11/2014.

CLINICAL DATA: Previous T10 vertebral augmentation. New onset of
severe mid and low back pain for 9 weeks. Patient reports some
relief of pain when lying down. Osteoporosis.

EXAM:
MRI THORACIC SPINE WITHOUT CONTRAST
TECHNIQUE: Multiplanar, multisequence MR imaging of the thoracic spine was
performed. No intravenous contrast was administered.

[Series 18: STIR · sagittal · 3.0mm · 0.62mm/px · 3 of 17 slices shown]
[im 1/17]
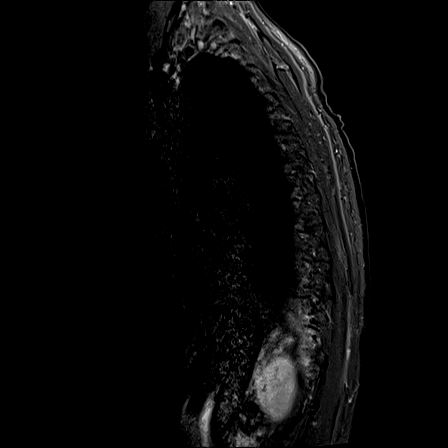
[im 9/17]
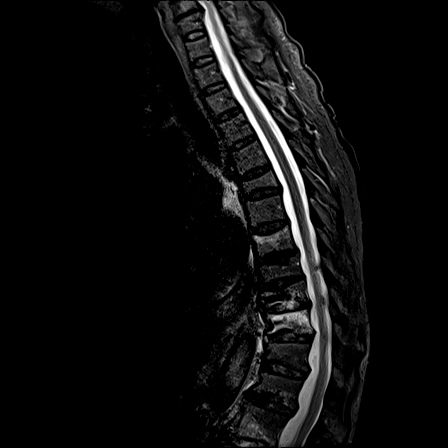
[im 17/17]
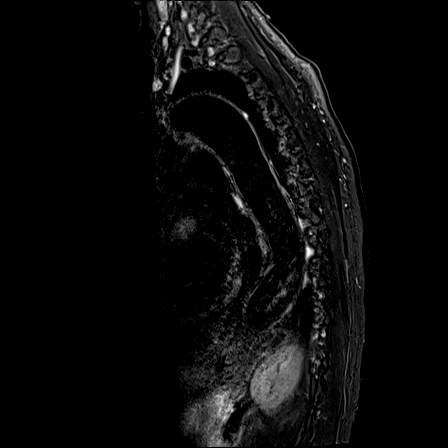

[Series 19: T2 · sagittal · 3.0mm · 0.62mm/px · 5 of 17 slices shown (1 of 2)]
[im 1/17]
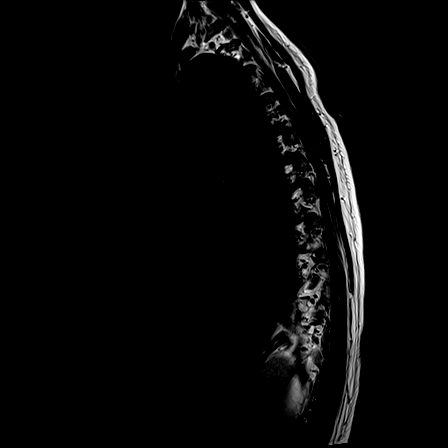
[im 5/17]
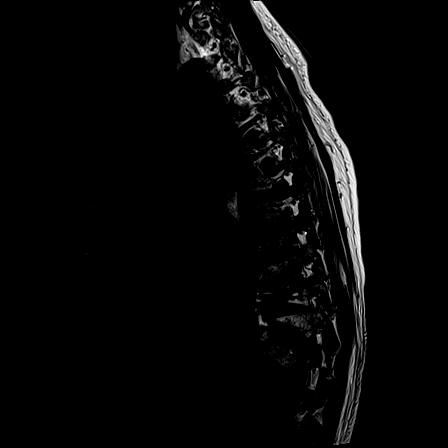
[im 9/17]
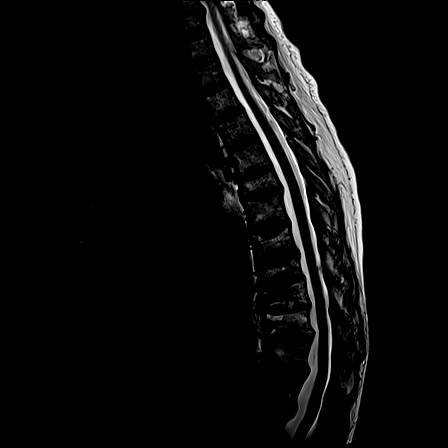
[im 13/17]
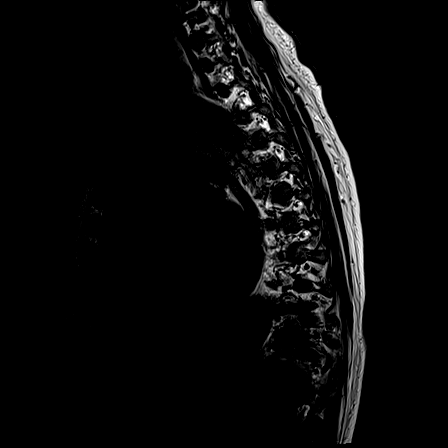
[im 17/17]
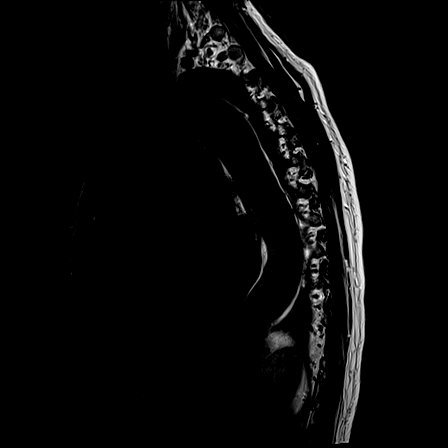

[Series 20: T2 · axial · 4.0mm · 0.28mm/px · z∈[-223,-38]mm · 7 of 54 slices shown (2 of 2)]
[im 1/54]
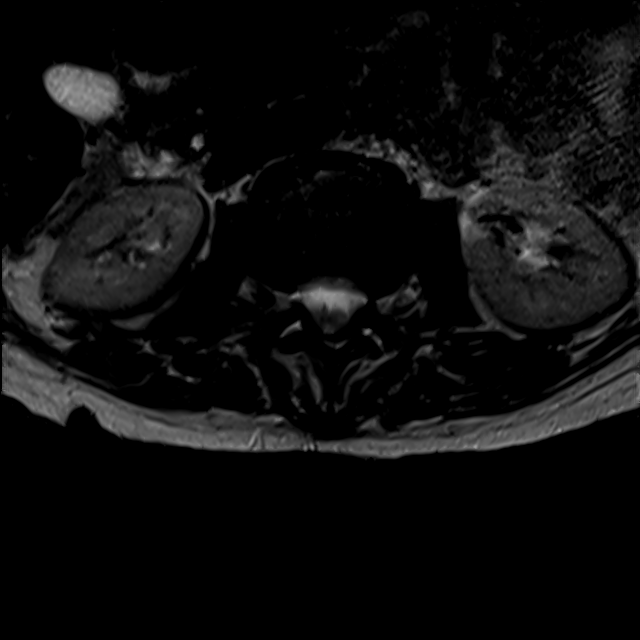
[im 8/54]
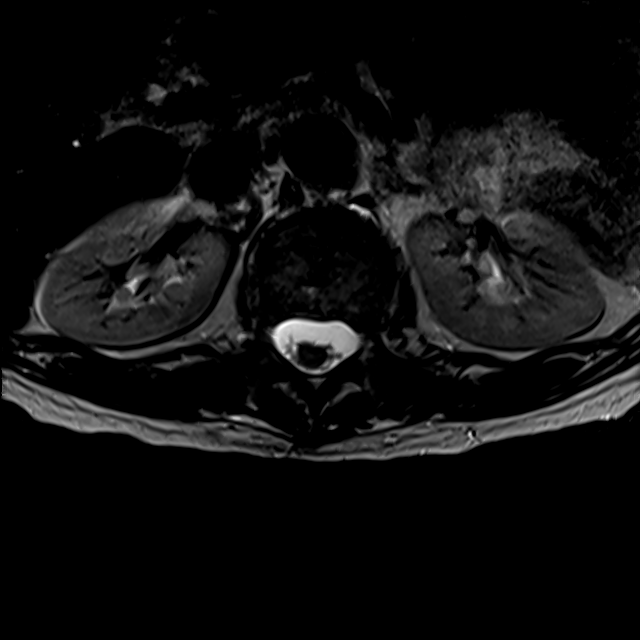
[im 16/54]
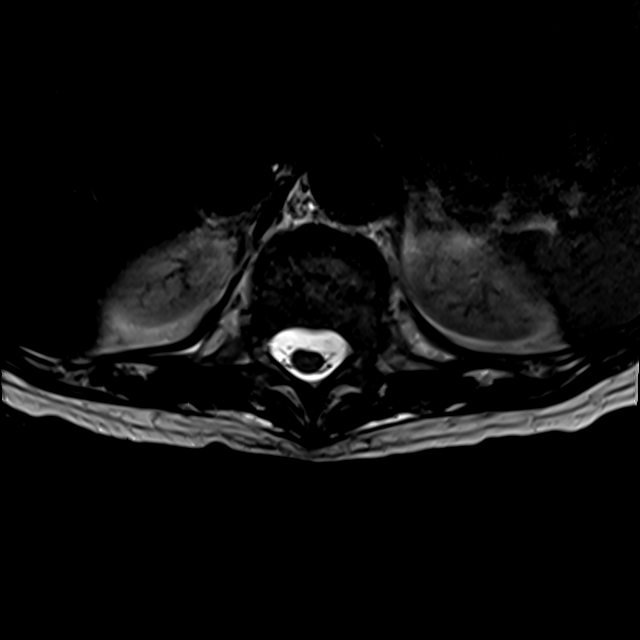
[im 23/54]
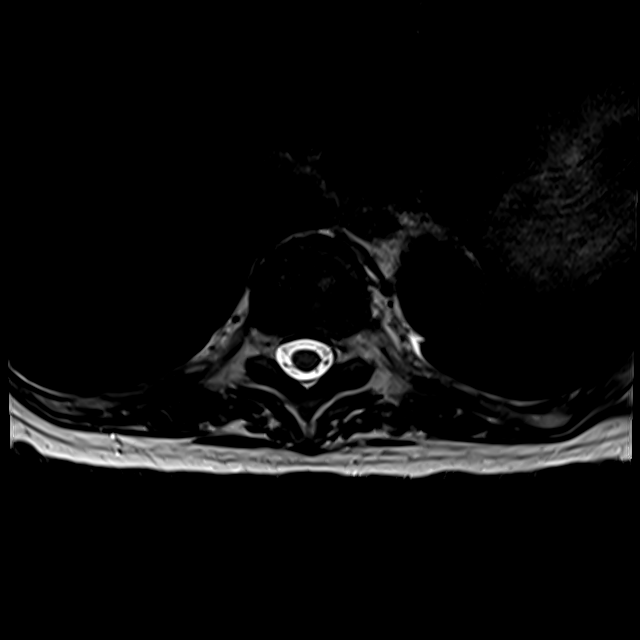
[im 27/54]
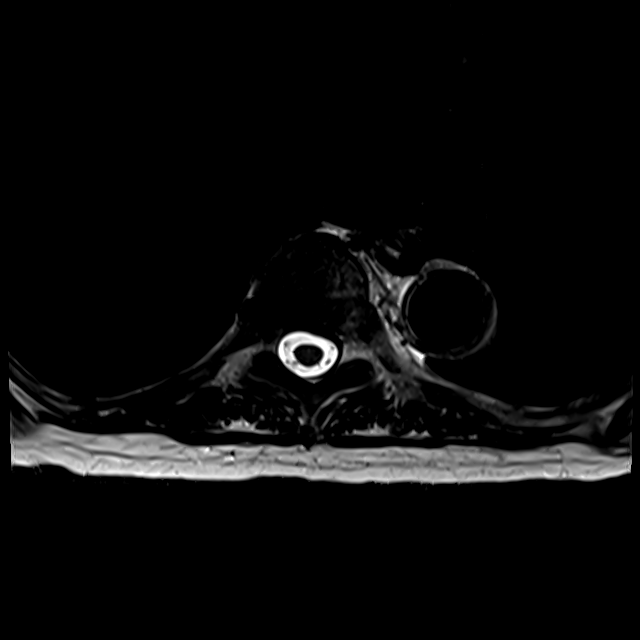
[im 31/54]
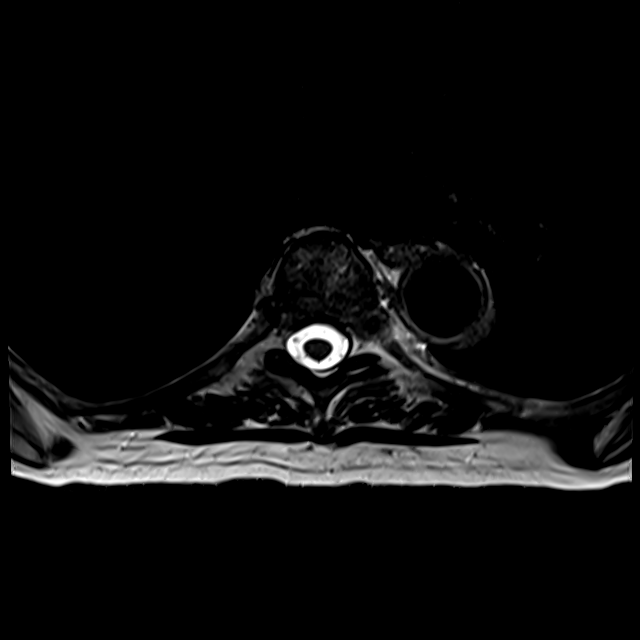
[im 46/54]
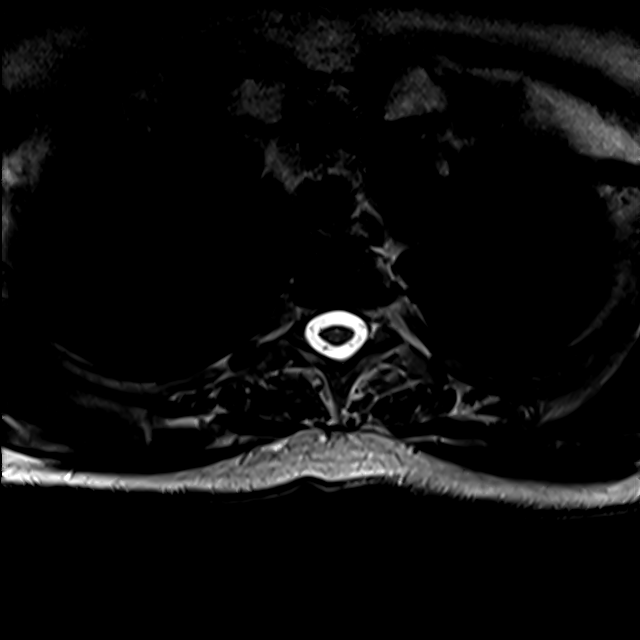

[Series 22: T1 · sagittal · 3.0mm · 0.62mm/px · 3 of 17 slices shown]
[im 1/17]
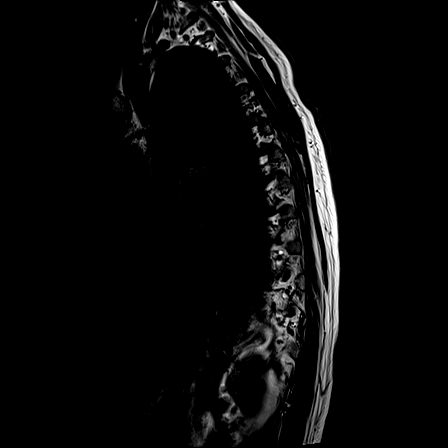
[im 9/17]
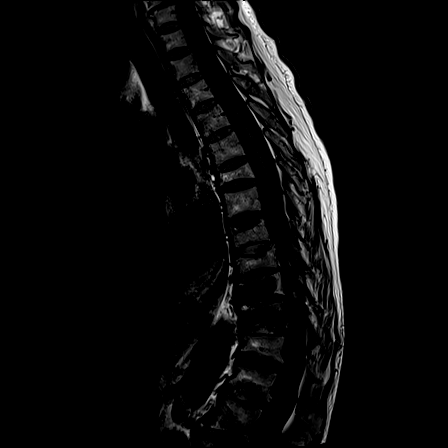
[im 17/17]
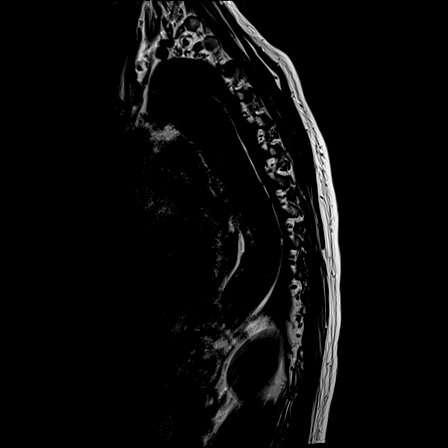

[18 of 48 positions shown; findings below may reference images not displayed]

FINDINGS: Alignment: AP alignment is anatomic. There is slight exaggeration of
lower thoracic kyphosis, likely associated with compression
fractures.

Vertebrae: Previous spinal augmentation is noted at T10 without
significant edema. An inferior endplate compression fracture is
present at T11 there is 30% loss of height anteriorly without
significant retropulsion of bone. Diffuse edema is present through
the vertebral body and extending slightly into the pedicles. No mass
lesion is present.

Remote inferior endplate fractures at T12 and L1 are noted without
edema. There is a remote superior endplate fracture at T9 inferior
endplate fracture at T6, both without edema. A superior endplate
fracture at T8 demonstrates greater than 30% loss of height
anteriorly without significant retropulsion of bone. There is some
edema subjacent to the superior endplate fracture. Vertebral body
heights and marrow are otherwise normal.

Cord: Normal signal is present throughout the thoracic spinal cord
to the conus medullaris which terminates at L2.

Paraspinal and other soft tissues: The visualized paraspinous soft
tissues are unremarkable. Lung fields are clear.

Disc levels:

No significant central disc protrusion is present. The central canal
is patent. Mild foraminal narrowing bilaterally at T10-11 is due to
facet arthropathy. Foramina are otherwise patent.
IMPRESSION: 1. Acute or subacute compression fractures at T8 and T11 as
described. There is more extensive edema at T11 than at T8.
2. No evidence for metastatic disease or primary bone tumor.
3. Healed T10 compression fracture following vertebral augmentation.
4. Remote fractures at T6, T9, T12, and L1 without significant edema
at these levels.
5. No significant central canal stenosis.
6. Mild foraminal narrowing bilaterally at T10-11 secondary to facet
arthropathy.

## 2020-04-22 ENCOUNTER — Other Ambulatory Visit: Payer: Self-pay | Admitting: Orthopedic Surgery

## 2020-05-04 ENCOUNTER — Other Ambulatory Visit (HOSPITAL_COMMUNITY)
Admission: RE | Admit: 2020-05-04 | Discharge: 2020-05-04 | Disposition: A | Discharge status: Home / Self Care | Payer: Medicare Other | Source: Ambulatory Visit | Attending: Orthopedic Surgery | Admitting: Orthopedic Surgery

## 2020-05-04 DIAGNOSIS — Z01812 Encounter for preprocedural laboratory examination: Secondary | ICD-10-CM | POA: Diagnosis present

## 2020-05-04 DIAGNOSIS — Z20822 Contact with and (suspected) exposure to covid-19: Secondary | ICD-10-CM | POA: Diagnosis not present

## 2020-05-04 LAB — SARS CORONAVIRUS 2 (TAT 6-24 HRS): SARS Coronavirus 2: NEGATIVE

## 2020-05-05 ENCOUNTER — Encounter (HOSPITAL_COMMUNITY): Payer: Self-pay | Admitting: Orthopedic Surgery

## 2020-05-05 ENCOUNTER — Other Ambulatory Visit: Payer: Self-pay

## 2020-05-05 NOTE — Progress Notes (Addendum)
Ms Lookingbill denies chest pain or shortness of breath. Patient was tested for Covid and has been in quarantine since that time.  Ms Cass said that surgery should not be on T1, she saw the MRI and it is lower than T1.  I told her I would call Lupita Leash at Dr.Dumonski's office. I spoke to Lupita Leash, she checked the template that Dr. Yevette Edwards that Dr. Yevette Edwards signed that has Lumbar 2, Thoracic 1. Lupita Leash said she will send a message to Dr.Dumonski to share patient's concern, Lupita Leash said she will call patient.  I asked Lupita Leash to tell Ms Swiney and tell her that she arrive at 55 instead of 49, Lupita Leash said she will.

## 2020-05-06 ENCOUNTER — Encounter (HOSPITAL_COMMUNITY): Payer: Self-pay | Admitting: Orthopedic Surgery

## 2020-05-06 ENCOUNTER — Ambulatory Visit (HOSPITAL_COMMUNITY): Payer: Medicare Other | Admitting: Certified Registered Nurse Anesthetist

## 2020-05-06 ENCOUNTER — Encounter (HOSPITAL_COMMUNITY)
Admission: RE | Disposition: A | Discharge status: Home / Self Care | Payer: Self-pay | Source: Ambulatory Visit | Attending: Orthopedic Surgery

## 2020-05-06 ENCOUNTER — Ambulatory Visit (HOSPITAL_COMMUNITY)
Admission: RE | Admit: 2020-05-06 | Discharge: 2020-05-06 | Disposition: A | Discharge status: Home / Self Care | Payer: Medicare Other | Source: Ambulatory Visit | Attending: Orthopedic Surgery | Admitting: Orthopedic Surgery

## 2020-05-06 ENCOUNTER — Ambulatory Visit (HOSPITAL_COMMUNITY): Payer: Medicare Other

## 2020-05-06 DIAGNOSIS — Z419 Encounter for procedure for purposes other than remedying health state, unspecified: Secondary | ICD-10-CM

## 2020-05-06 DIAGNOSIS — Z885 Allergy status to narcotic agent status: Secondary | ICD-10-CM | POA: Diagnosis not present

## 2020-05-06 DIAGNOSIS — Z79899 Other long term (current) drug therapy: Secondary | ICD-10-CM | POA: Diagnosis not present

## 2020-05-06 DIAGNOSIS — M8008XA Age-related osteoporosis with current pathological fracture, vertebra(e), initial encounter for fracture: Secondary | ICD-10-CM | POA: Diagnosis not present

## 2020-05-06 HISTORY — PX: KYPHOPLASTY: SHX5884

## 2020-05-06 HISTORY — DX: Hyperlipidemia, unspecified: E78.5

## 2020-05-06 LAB — CBC WITH DIFFERENTIAL/PLATELET
Abs Immature Granulocytes: 0.01 10*3/uL (ref 0.00–0.07)
Basophils Absolute: 0 10*3/uL (ref 0.0–0.1)
Basophils Relative: 1 %
Eosinophils Absolute: 0.1 10*3/uL (ref 0.0–0.5)
Eosinophils Relative: 2 %
HCT: 39.4 % (ref 36.0–46.0)
Hemoglobin: 13.6 g/dL (ref 12.0–15.0)
Immature Granulocytes: 0 %
Lymphocytes Relative: 26 %
Lymphs Abs: 1.3 10*3/uL (ref 0.7–4.0)
MCH: 33.4 pg (ref 26.0–34.0)
MCHC: 34.5 g/dL (ref 30.0–36.0)
MCV: 96.8 fL (ref 80.0–100.0)
Monocytes Absolute: 0.7 10*3/uL (ref 0.1–1.0)
Monocytes Relative: 13 %
Neutro Abs: 2.8 10*3/uL (ref 1.7–7.7)
Neutrophils Relative %: 58 %
Platelets: 252 10*3/uL (ref 150–400)
RBC: 4.07 MIL/uL (ref 3.87–5.11)
RDW: 12.6 % (ref 11.5–15.5)
WBC: 4.9 10*3/uL (ref 4.0–10.5)
nRBC: 0 % (ref 0.0–0.2)

## 2020-05-06 LAB — COMPREHENSIVE METABOLIC PANEL
ALT: 16 U/L (ref 0–44)
AST: 19 U/L (ref 15–41)
Albumin: 4.3 g/dL (ref 3.5–5.0)
Alkaline Phosphatase: 45 U/L (ref 38–126)
Anion gap: 8 (ref 5–15)
BUN: 9 mg/dL (ref 8–23)
CO2: 23 mmol/L (ref 22–32)
Calcium: 9.4 mg/dL (ref 8.9–10.3)
Chloride: 105 mmol/L (ref 98–111)
Creatinine, Ser: 0.57 mg/dL (ref 0.44–1.00)
GFR, Estimated: >60 mL/min (ref >60)
Glucose, Bld: 94 mg/dL (ref 70–99)
Potassium: 3.5 mmol/L (ref 3.5–5.1)
Sodium: 136 mmol/L (ref 135–145)
Total Bilirubin: 0.8 mg/dL (ref 0.3–1.2)
Total Protein: 7.1 g/dL (ref 6.5–8.1)

## 2020-05-06 LAB — APTT: aPTT: 31 seconds (ref 24–36)

## 2020-05-06 LAB — PROTIME-INR
INR: 1 (ref 0.8–1.2)
Prothrombin Time: 13.1 seconds (ref 11.4–15.2)

## 2020-05-06 LAB — TYPE AND SCREEN
ABO/RH(D): A POS
Antibody Screen: NEGATIVE

## 2020-05-06 SURGERY — KYPHOPLASTY
Anesthesia: General

## 2020-05-06 MED ORDER — IOPAMIDOL (ISOVUE-300) INJECTION 61%
INTRAVENOUS | Status: DC | PRN
  Administered 2020-05-06: 20 mL

## 2020-05-06 MED ORDER — BACITRACIN ZINC 500 UNIT/GM EX OINT
TOPICAL_OINTMENT | CUTANEOUS | Status: AC
  Filled 2020-05-06: qty 28.35

## 2020-05-06 MED ORDER — BUPIVACAINE-EPINEPHRINE (PF) 0.25% -1:200000 IJ SOLN: INTRAMUSCULAR | Status: DC | PRN

## 2020-05-06 MED ORDER — POVIDONE-IODINE 7.5 % EX SOLN
Freq: Once | CUTANEOUS | Status: DC
  Filled 2020-05-06 (×2): qty 118

## 2020-05-06 MED ORDER — ONDANSETRON HCL 4 MG/2ML IJ SOLN
4.0000 mg | Freq: Four times a day (QID) | INTRAMUSCULAR | Status: DC | PRN
Start: 2020-05-06 — End: 2020-05-06

## 2020-05-06 MED ORDER — MIDAZOLAM HCL 2 MG/2ML IJ SOLN
INTRAMUSCULAR | Status: DC | PRN
  Administered 2020-05-06: 2 mg via INTRAVENOUS

## 2020-05-06 MED ORDER — PHENYLEPHRINE HCL (PRESSORS) 10 MG/ML IV SOLN
INTRAVENOUS | Status: DC | PRN
  Administered 2020-05-06 (×2): 40 ug via INTRAVENOUS

## 2020-05-06 MED ORDER — OXYCODONE HCL 5 MG/5ML PO SOLN: 5.0000 mg | Freq: Once | ORAL | Status: AC | PRN

## 2020-05-06 MED ORDER — ACETAMINOPHEN 10 MG/ML IV SOLN
INTRAVENOUS | Status: AC
  Filled 2020-05-06: qty 100

## 2020-05-06 MED ORDER — PROPOFOL 10 MG/ML IV BOLUS
INTRAVENOUS | Status: AC
  Filled 2020-05-06: qty 20

## 2020-05-06 MED ORDER — SUCCINYLCHOLINE CHLORIDE 200 MG/10ML IV SOSY
PREFILLED_SYRINGE | INTRAVENOUS | Status: DC | PRN
  Administered 2020-05-06: 100 mg via INTRAVENOUS

## 2020-05-06 MED ORDER — HYDROCODONE-ACETAMINOPHEN 5-325 MG PO TABS: 1.0000 | ORAL_TABLET | Freq: Three times a day (TID) | ORAL | 0 refills | Status: DC | PRN

## 2020-05-06 MED ORDER — CHLORHEXIDINE GLUCONATE 0.12 % MT SOLN: 15.0000 mL | Freq: Once | OROMUCOSAL | Status: AC

## 2020-05-06 MED ORDER — FENTANYL CITRATE (PF) 100 MCG/2ML IJ SOLN
INTRAMUSCULAR | Status: AC
  Administered 2020-05-06: 25 ug via INTRAVENOUS
  Filled 2020-05-06: qty 2

## 2020-05-06 MED ORDER — FENTANYL CITRATE (PF) 250 MCG/5ML IJ SOLN
INTRAMUSCULAR | Status: AC
  Filled 2020-05-06: qty 5

## 2020-05-06 MED ORDER — PROPOFOL 10 MG/ML IV BOLUS
INTRAVENOUS | Status: DC | PRN
  Administered 2020-05-06: 120 mg via INTRAVENOUS

## 2020-05-06 MED ORDER — OXYCODONE HCL 5 MG PO TABS: 5.0000 mg | ORAL_TABLET | Freq: Once | ORAL | Status: AC | PRN

## 2020-05-06 MED ORDER — 0.9 % SODIUM CHLORIDE (POUR BTL) OPTIME
TOPICAL | Status: DC | PRN
  Administered 2020-05-06 (×2): 1000 mL

## 2020-05-06 MED ORDER — ONDANSETRON HCL 4 MG/2ML IJ SOLN
INTRAMUSCULAR | Status: DC | PRN
  Administered 2020-05-06: 4 mg via INTRAVENOUS

## 2020-05-06 MED ORDER — SUGAMMADEX SODIUM 200 MG/2ML IV SOLN
INTRAVENOUS | Status: DC | PRN
  Administered 2020-05-06: 200 mg via INTRAVENOUS

## 2020-05-06 MED ORDER — OXYCODONE HCL 5 MG PO TABS
ORAL_TABLET | ORAL | Status: AC
  Administered 2020-05-06: 5 mg via ORAL
  Filled 2020-05-06: qty 1

## 2020-05-06 MED ORDER — FENTANYL CITRATE (PF) 250 MCG/5ML IJ SOLN
INTRAMUSCULAR | Status: DC | PRN
  Administered 2020-05-06 (×3): 50 ug via INTRAVENOUS

## 2020-05-06 MED ORDER — CEFAZOLIN SODIUM-DEXTROSE 2-4 GM/100ML-% IV SOLN
2.0000 g | INTRAVENOUS | Status: AC
  Administered 2020-05-06: 2 g via INTRAVENOUS
  Filled 2020-05-06: qty 100

## 2020-05-06 MED ORDER — MIDAZOLAM HCL 2 MG/2ML IJ SOLN
INTRAMUSCULAR | Status: AC
  Filled 2020-05-06: qty 2

## 2020-05-06 MED ORDER — ACETAMINOPHEN 10 MG/ML IV SOLN
INTRAVENOUS | Status: DC | PRN
  Administered 2020-05-06: 1000 mg via INTRAVENOUS

## 2020-05-06 MED ORDER — DEXAMETHASONE SODIUM PHOSPHATE 10 MG/ML IJ SOLN
INTRAMUSCULAR | Status: DC | PRN
  Administered 2020-05-06: 10 mg via INTRAVENOUS

## 2020-05-06 MED ORDER — CHLORHEXIDINE GLUCONATE 0.12 % MT SOLN
OROMUCOSAL | Status: AC
  Administered 2020-05-06: 15 mL via OROMUCOSAL
  Filled 2020-05-06: qty 15

## 2020-05-06 MED ORDER — ROCURONIUM BROMIDE 10 MG/ML (PF) SYRINGE
PREFILLED_SYRINGE | INTRAVENOUS | Status: DC | PRN
  Administered 2020-05-06: 50 mg via INTRAVENOUS

## 2020-05-06 MED ORDER — ORAL CARE MOUTH RINSE
15.0000 mL | Freq: Once | OROMUCOSAL | Status: AC
Start: 2020-05-06 — End: 2020-05-06

## 2020-05-06 MED ORDER — BUPIVACAINE-EPINEPHRINE (PF) 0.25% -1:200000 IJ SOLN
INTRAMUSCULAR | Status: AC
  Filled 2020-05-06: qty 30

## 2020-05-06 MED ORDER — LACTATED RINGERS IV SOLN: INTRAVENOUS | Status: DC

## 2020-05-06 MED ORDER — BACITRACIN ZINC 500 UNIT/GM EX OINT
TOPICAL_OINTMENT | CUTANEOUS | Status: DC | PRN
  Administered 2020-05-06: 1 via TOPICAL

## 2020-05-06 MED ORDER — LIDOCAINE 2% (20 MG/ML) 5 ML SYRINGE
INTRAMUSCULAR | Status: DC | PRN
  Administered 2020-05-06: 60 mg via INTRAVENOUS

## 2020-05-06 MED ORDER — FENTANYL CITRATE (PF) 100 MCG/2ML IJ SOLN
25.0000 ug | INTRAMUSCULAR | Status: DC | PRN
  Administered 2020-05-06: 25 ug via INTRAVENOUS

## 2020-05-06 SURGICAL SUPPLY — 44 items
BLADE SURG 15 STRL LF DISP TIS (BLADE) ×1 IMPLANT
BLADE SURG 15 STRL SS (BLADE) ×2
CEMENT BONE KYPHX HV R (Orthopedic Implant) ×1 IMPLANT
COVER MAYO STAND STRL (DRAPES) ×2 IMPLANT
COVER SURGICAL LIGHT HANDLE (MISCELLANEOUS) ×2 IMPLANT
COVER WAND RF STERILE (DRAPES) ×2 IMPLANT
DRAPE C-ARM 42X72 X-RAY (DRAPES) ×2 IMPLANT
DRAPE HALF SHEET 40X57 (DRAPES) ×1 IMPLANT
DRAPE INCISE IOBAN 66X45 STRL (DRAPES) ×2 IMPLANT
DRAPE LAPAROTOMY T 102X78X121 (DRAPES) ×2 IMPLANT
DRAPE SURG 17X23 STRL (DRAPES) ×8 IMPLANT
DRAPE WARM FLUID 44X44 (DRAPES) ×2 IMPLANT
DURAPREP 26ML APPLICATOR (WOUND CARE) ×2 IMPLANT
GAUZE 4X4 16PLY RFD (DISPOSABLE) ×2 IMPLANT
GAUZE SPONGE 2X2 8PLY STRL LF (GAUZE/BANDAGES/DRESSINGS) ×1 IMPLANT
GLOVE BIO SURGEON STRL SZ7 (GLOVE) ×2 IMPLANT
GLOVE BIO SURGEON STRL SZ8 (GLOVE) ×2 IMPLANT
GLOVE SRG 8 PF TXTR STRL LF DI (GLOVE) ×1 IMPLANT
GLOVE SURG UNDER POLY LF SZ7 (GLOVE) ×2 IMPLANT
GLOVE SURG UNDER POLY LF SZ8 (GLOVE) ×2
GOWN STRL REUS W/ TWL LRG LVL3 (GOWN DISPOSABLE) ×2 IMPLANT
GOWN STRL REUS W/ TWL XL LVL3 (GOWN DISPOSABLE) ×1 IMPLANT
GOWN STRL REUS W/TWL LRG LVL3 (GOWN DISPOSABLE) ×4
GOWN STRL REUS W/TWL XL LVL3 (GOWN DISPOSABLE) ×2
KIT BASIN OR (CUSTOM PROCEDURE TRAY) ×2 IMPLANT
KIT TURNOVER KIT B (KITS) ×2 IMPLANT
NDL HYPO 25X1 1.5 SAFETY (NEEDLE) ×1 IMPLANT
NDL SPNL 18GX3.5 QUINCKE PK (NEEDLE) ×2 IMPLANT
NEEDLE 22X1 1/2 (OR ONLY) (NEEDLE) ×1 IMPLANT
NEEDLE HYPO 25X1 1.5 SAFETY (NEEDLE) ×2 IMPLANT
NEEDLE SPNL 18GX3.5 QUINCKE PK (NEEDLE) ×4 IMPLANT
NS IRRIG 1000ML POUR BTL (IV SOLUTION) ×2 IMPLANT
PACK UNIVERSAL I (CUSTOM PROCEDURE TRAY) ×2 IMPLANT
PAD ARMBOARD 7.5X6 YLW CONV (MISCELLANEOUS) ×4 IMPLANT
POSITIONER HEAD PRONE TRACH (MISCELLANEOUS) ×2 IMPLANT
SPONGE GAUZE 2X2 8PLY STRL LF (GAUZE/BANDAGES/DRESSINGS) ×1 IMPLANT
SPONGE GAUZE 2X2 STER 10/PKG (GAUZE/BANDAGES/DRESSINGS) ×1
SUT MNCRL AB 4-0 PS2 18 (SUTURE) ×2 IMPLANT
SYR BULB IRRIG 60ML STRL (SYRINGE) ×2 IMPLANT
SYR CONTROL 10ML LL (SYRINGE) ×2 IMPLANT
TAPE CLOTH SURG 4X10 WHT LF (GAUZE/BANDAGES/DRESSINGS) ×1 IMPLANT
TOWEL GREEN STERILE (TOWEL DISPOSABLE) ×2 IMPLANT
TOWEL GREEN STERILE FF (TOWEL DISPOSABLE) ×2 IMPLANT
TRAY KYPHOPAK 15/3 ONESTEP 1ST (MISCELLANEOUS) ×1 IMPLANT

## 2020-05-06 NOTE — Transfer of Care (Signed)
Immediate Anesthesia Transfer of Care Note  Patient: Susan Case  Procedure(s) Performed: LUMBAR KYPHOPLASTY (N/A )  Patient Location: PACU  Anesthesia Type:General  Level of Consciousness: awake, alert  and oriented  Airway & Oxygen Therapy: Patient Spontanous Breathing  Post-op Assessment: Report given to RN and Post -op Vital signs reviewed and stable  Post vital signs: Reviewed and stable  Last Vitals:  Vitals Value Taken Time  BP 139/82 05/06/20 1645  Temp    Pulse 78 05/06/20 1645  Resp 18 05/06/20 1645  SpO2 97 % 05/06/20 1645  Vitals shown include unvalidated device data.  Last Pain:  Vitals:   05/06/20 1106  TempSrc:   PainSc: 5       Patients Stated Pain Goal: 5 (05/06/20 1106)  Complications: No complications documented.

## 2020-05-06 NOTE — Anesthesia Preprocedure Evaluation (Signed)
Anesthesia Evaluation  Patient identified by MRN, date of birth, ID band Patient awake    Reviewed: Allergy & Precautions, H&P , NPO status , Patient's Chart, lab work & pertinent test results  Airway Mallampati: II   Neck ROM: full    Dental   Pulmonary neg pulmonary ROS,    breath sounds clear to auscultation       Cardiovascular negative cardio ROS   Rhythm:regular Rate:Normal     Neuro/Psych    GI/Hepatic   Endo/Other    Renal/GU      Musculoskeletal  (+) Arthritis , Osteoporosis. Vertebral body compression fx   Abdominal   Peds  Hematology   Anesthesia Other Findings   Reproductive/Obstetrics                             Anesthesia Physical Anesthesia Plan  ASA: II  Anesthesia Plan: General   Post-op Pain Management:    Induction: Intravenous  PONV Risk Score and Plan: 3 and Ondansetron, Dexamethasone, Midazolam and Treatment may vary due to age or medical condition  Airway Management Planned: Oral ETT  Additional Equipment:   Intra-op Plan:   Post-operative Plan: Extubation in OR  Informed Consent: I have reviewed the patients History and Physical, chart, labs and discussed the procedure including the risks, benefits and alternatives for the proposed anesthesia with the patient or authorized representative who has indicated his/her understanding and acceptance.     Dental advisory given  Plan Discussed with: CRNA, Anesthesiologist and Surgeon  Anesthesia Plan Comments:         Anesthesia Quick Evaluation

## 2020-05-06 NOTE — H&P (Addendum)
PREOPERATIVE H&P  Chief Complaint: Low back pain  HPI: Susan Case is a 65 y.o. female who presents with ongoing pain in the low back  Advanced imaging reveals compression fractures at L2 and T1  Patient has failed multiple forms of conservative care and continues to have pain (see office notes for additional details regarding the patient's full course of treatment)  Past Medical History:  Diagnosis Date  . Arthritis    osteoporosis  . Hyperlipemia   . Osteoporosis    Past Surgical History:  Procedure Laterality Date  . KYPHOPLASTY N/A 06/11/2014   Procedure: KYPHOPLASTY;  Surgeon: Estill Bamberg, MD;  Location: [facility] OR;  Service: Orthopedics;  Laterality: N/A;  T10 kyphoplasty  . KYPHOPLASTY N/A 10/05/2017   Procedure: T8 AND T11 KYPHOPLASTY;  Surgeon: Estill Bamberg, MD;  Location: [facility] OR;  Service: Orthopedics;  Laterality: N/A;   Social History   Socioeconomic History  . Marital status: Divorced    Spouse name: Not on file  . Number of children: Not on file  . Years of education: Not on file  . Highest education level: Not on file  Occupational History  . Not on file  Tobacco Use  . Smoking status: Never Smoker  . Smokeless tobacco: Never Used  Vaping Use  . Vaping Use: Never used  Substance and Sexual Activity  . Alcohol use: Not Currently  . Drug use: No  . Sexual activity: Not on file  Other Topics Concern  . Not on file  Social History Narrative  . Not on file   Social Determinants of Health   Financial Resource Strain: Not on file  Food Insecurity: Not on file  Transportation Needs: Not on file  Physical Activity: Not on file  Stress: Not on file  Social Connections: Not on file   History reviewed. No pertinent family history. Allergies  Allergen Reactions  . Codeine Itching and Nausea Only   Prior to Admission medications   Medication Sig Start Date End Date Taking? Authorizing Provider  acetaminophen (TYLENOL) 500 MG tablet Take 1,000 mg by  mouth in the morning and at bedtime.   Yes [provider]  calcitonin, salmon, (MIACALCIN/FORTICAL) 200 UNIT/ACT nasal spray Place 1 spray into alternate nostrils in the morning. 03/04/20  Yes [provider]  calcium carbonate (OSCAL) 1500 (600 Ca) MG TABS tablet Take 600 mg of elemental calcium by mouth in the morning and at bedtime.   Yes [provider]  Cholecalciferol (NATURAL VITAMIN D-3) 125 MCG (5000 UT) TABS Take 5,000 Units by mouth in the morning.   Yes [provider]  gabapentin (NEURONTIN) 300 MG capsule Take 300 mg by mouth at bedtime. 04/13/20  Yes [provider]  HYDROcodone-acetaminophen (NORCO/VICODIN) 5-325 MG tablet Take 1 tablet by mouth 3 (three) times daily as needed (pain). 04/13/20  Yes [provider]  Menaquinone-7 (VITAMIN K2) 100 MCG CAPS Take 100 mcg by mouth in the morning.   Yes [provider]  rosuvastatin (CRESTOR) 5 MG tablet Take 5 mg by mouth in the morning. 03/30/20  Yes [provider]  traMADol (ULTRAM) 50 MG tablet Take 50 mg by mouth 3 (three) times daily as needed (pain). 03/26/20  Yes [provider]  traZODone (DESYREL) 50 MG tablet Take 50 mg by mouth at bedtime as needed (sleep).  09/24/17  Yes [provider]     All other systems have been reviewed and were otherwise negative with the exception of those mentioned  in the HPI and as above.  Physical Exam: Vitals:   05/06/20 1034  BP: (!) 148/85  Pulse: 88  Resp: 18  Temp: 98.2 F (36.8 C)  SpO2: 99%    Body mass index is 18.58 kg/m.  General: Alert, no acute distress Cardiovascular: No pedal edema Respiratory: No cyanosis, no use of accessory musculature Skin: No lesions in the area of chief complaint Neurologic: Sensation intact distally Psychiatric: Patient is competent for consent with normal mood and affect Lymphatic: No axillary or cervical lymphadenopathy   Assessment/Plan: LUMBAR 2 AND  THORACIC 1 COMPRESSION FRACTURES Plan for Procedure(s): LUMBAR 2 KYPHOPLASTY, POSSIBLE THORACIC 1 KYPHOPLASTY   Jackelyn Hoehn, MD 05/06/2020 2:31 PM

## 2020-05-06 NOTE — Op Note (Addendum)
PATIENT NAME: Susan Case   MEDICAL RECORD NO.:   [id number]    DATE OF BIRTH: 05-29-1955   DATE OF PROCEDURE: 05/06/2020                              OPERATIVE REPORT   PREOPERATIVE DIAGNOSIS:  L2 and T1 osteoporotic compression fractures.  POSTOPERATIVE DIAGNOSIS:  L2 and T1 osteoporotic compression fractures.  PROCEDURE:  L2 kyphoplasty.  SURGEON:  Estill Bamberg, MD.  ASSISTANTJason Coop, PA-C.  ANESTHESIA:  General endotracheal anesthesia.  COMPLICATIONS:  None.  DISPOSITION:  Stable.  ESTIMATED BLOOD LOSS:  Minimal.  INDICATIONS FOR SURGERY:  Briefly, [name] is a very pleasant 28- year-old patient, who did have an onset of pain in both her upper and lower back.   Her pain was rather severe.  The patient's imaging studies did clearly reveal compression fractures at T1 and L2.  There is also noted to be a previously identified T4 compression fracture, with minimal edema, which did appear to be healing. The patient felt rather debilitated as a result of her ongoing pain.  Given her ongoing pain and dysfunction, we did discuss proceeding with the procedure noted above.  I did fully discuss the procedure with the patient, and she did wish to proceed.  Of note, I did discuss with the patient prior to surgery, that the T1 level is extremely difficult to visualize adequately intraoperatively, and that it was likely that we would not be able to proceed with a T1 kyphoplasty.  I did however let her know that we would attempt at obtaining imaging, and if it felt that it was safe to proceed, then we would.  OPERATIVE DETAILS:  On 05/06/2020, the patient was brought to surgery and general endotracheal anesthesia was administered.  The patient was placed prone on a well-padded flat Jackson bed with gel rolls placed under the patient's chest and hips.  Antibiotics were given.  AP and lateral fluoroscopy was brought into the field.  I did attempt to obtain adequate imaging of T1.   I was able to obtain appropriate AP images, however, as anticipated, it was extremely difficult to adequately visualize T1 on lateral imaging.  I did discuss this distinct possibility with the patient.  At this point, we elected to not proceed with a procedure at T1, and at this point, attention was focused on L2.  The L2 pedicles were marked out in the usual fashion.  After a time-out procedure was performed, I did advance Jamshidis across the L2 pedicles on the right and left sides.  I then drilled through the Jamshidis.  I then inserted kyphoplasty balloons and I was able to inflate the balloons with approximately 3cc of contrast.  Partial restoration of the superior endplate was noted.  At this point, after the cement was mixed, a total of approximately 6cc of cement was injected, half on the right, and half on the left.  Excellent interdigitation of cement was identified. There was no abnormal extravasation of cement identified.  The cement was then allowed to harden, after which point the Jamshidis were removed.  The wound  was then irrigated and closed using 4-0 Monocryl.  Bacitracin and a sterile dressing were applied.  The patient was then awoken from general endotracheal anesthesia and transferred to recovery in stable condition.   Estill Bamberg, MD

## 2020-05-06 NOTE — Anesthesia Procedure Notes (Signed)
Procedure Name: Intubation Date/Time: 05/06/2020 3:38 PM Performed by: Clearnce Sorrel, CRNA Pre-anesthesia Checklist: Patient identified, Emergency Drugs available, Suction available, Patient being monitored and Timeout performed Patient Re-evaluated:Patient Re-evaluated prior to induction Oxygen Delivery Method: Circle system utilized Preoxygenation: Pre-oxygenation with 100% oxygen Induction Type: Rapid sequence Laryngoscope Size: Mac and 3 Grade View: Grade II Tube type: Oral Tube size: 7.0 mm Number of attempts: 1 Airway Equipment and Method: Stylet Placement Confirmation: ETT inserted through vocal cords under direct vision,  positive ETCO2 and breath sounds checked- equal and bilateral Secured at: 21 cm Tube secured with: Tape Dental Injury: Teeth and Oropharynx as per pre-operative assessment

## 2020-05-07 ENCOUNTER — Encounter (HOSPITAL_COMMUNITY): Payer: Self-pay | Admitting: Orthopedic Surgery

## 2020-05-07 NOTE — Anesthesia Postprocedure Evaluation (Signed)
Anesthesia Post Note  Patient: Susan Case  Procedure(s) Performed: LUMBAR KYPHOPLASTY (N/A )     Patient location during evaluation: PACU Anesthesia Type: General Level of consciousness: awake and alert Pain management: pain level controlled Vital Signs Assessment: post-procedure vital signs reviewed and stable Respiratory status: spontaneous breathing, nonlabored ventilation, respiratory function stable and patient connected to nasal cannula oxygen Cardiovascular status: blood pressure returned to baseline and stable Postop Assessment: no apparent nausea or vomiting Anesthetic complications: no   No complications documented.  Last Vitals:  Vitals:   05/06/20 1715 05/06/20 1730  BP: 134/81 137/63  Pulse: (!) 58 (!) 59  Resp: 13 18  Temp:  36.6 C  SpO2: 97% 99%    Last Pain:  Vitals:   05/06/20 1730  TempSrc:   PainSc: 3                  Kristianne Albin S

## 2020-05-20 ENCOUNTER — Ambulatory Visit (INDEPENDENT_AMBULATORY_CARE_PROVIDER_SITE_OTHER): Payer: Medicare Other | Admitting: Family Medicine

## 2020-05-20 ENCOUNTER — Encounter: Payer: Self-pay | Admitting: Family Medicine

## 2020-05-20 ENCOUNTER — Other Ambulatory Visit: Payer: Self-pay

## 2020-05-20 DIAGNOSIS — M4850XA Collapsed vertebra, not elsewhere classified, site unspecified, initial encounter for fracture: Secondary | ICD-10-CM | POA: Insufficient documentation

## 2020-05-20 DIAGNOSIS — M8080XA Other osteoporosis with current pathological fracture, unspecified site, initial encounter for fracture: Secondary | ICD-10-CM

## 2020-05-20 DIAGNOSIS — M810 Age-related osteoporosis without current pathological fracture: Secondary | ICD-10-CM | POA: Insufficient documentation

## 2020-05-20 DIAGNOSIS — S22060A Wedge compression fracture of T7-T8 vertebra, initial encounter for closed fracture: Secondary | ICD-10-CM

## 2020-05-20 NOTE — Assessment & Plan Note (Signed)
Has multiple levels of kyphoplasty at T8, T10 and T11.  She has compression fractures, without trauma L2, L4, L5 and T9. -Pursue Evenity

## 2020-05-20 NOTE — Progress Notes (Signed)
  Susan Case - 65 y.o. female MRN [id number]  Date of birth: 1955-03-10  SUBJECTIVE:  Including CC & ROS.  No chief complaint on file.   Susan Case is a 65 y.o. female that is presenting with osteoporosis management.  She has severe osteoporosis with compression fractures of T8, T10, T11.  She has gone around kyphoplasty and has had improvement of her pain.  She has historically been on Forteo for about 18 months.  She has taken Fosamax previously but had significant GI upset and bone pain associated with it.   Review of Systems See HPI   HISTORY: Past Medical, Surgical, Social, and Family History Reviewed & Updated per EMR.   Pertinent Historical Findings include:  Past Medical History:  Diagnosis Date  . Arthritis    osteoporosis  . Hyperlipemia   . Osteoporosis     Past Surgical History:  Procedure Laterality Date  . KYPHOPLASTY N/A 06/11/2014   Procedure: KYPHOPLASTY;  Surgeon: Estill Bamberg, MD;  Location: [facility] OR;  Service: Orthopedics;  Laterality: N/A;  T10 kyphoplasty  . KYPHOPLASTY N/A 10/05/2017   Procedure: T8 AND T11 KYPHOPLASTY;  Surgeon: Estill Bamberg, MD;  Location: [facility] OR;  Service: Orthopedics;  Laterality: N/A;  . KYPHOPLASTY N/A 05/06/2020   Procedure: LUMBAR KYPHOPLASTY;  Surgeon: Estill Bamberg, MD;  Location: [facility] OR;  Service: Orthopedics;  Laterality: N/A;    No family history on file.  Social History   Socioeconomic History  . Marital status: Divorced    Spouse name: Not on file  . Number of children: Not on file  . Years of education: Not on file  . Highest education level: Not on file  Occupational History  . Not on file  Tobacco Use  . Smoking status: Never Smoker  . Smokeless tobacco: Never Used  Vaping Use  . Vaping Use: Never used  Substance and Sexual Activity  . Alcohol use: Not Currently  . Drug use: No  . Sexual activity: Not on file  Other Topics Concern  . Not on file  Social History Narrative  . Not on file   Social Determinants  of Health   Financial Resource Strain: Not on file  Food Insecurity: Not on file  Transportation Needs: Not on file  Physical Activity: Not on file  Stress: Not on file  Social Connections: Not on file  Intimate Partner Violence: Not on file     PHYSICAL EXAM:  VS: BP 130/90 (BP Location: Left Arm, Patient Position: Sitting, Cuff Size: Normal)   Ht 4\' 11"  (1.499 m)   Wt 88 lb (39.9 kg)   BMI 17.77 kg/m  Physical Exam Gen: NAD, alert, cooperative with exam, well-appearing    ASSESSMENT & PLAN:   Osteoporosis Has long history of osteoporosis and repeated compression fractures.  She has completed 18 months of Forteo and most recent bone density revealed -0.4. -Counseled on supportive care. -Currently taking vitamin K2 with vitamin D and calcium. -Pursue Evenity  Vertebral compression fracture (HCC) Has multiple levels of kyphoplasty at T8, T10 and T11.  She has compression fractures, without trauma L2, L4, L5 and T9. -Pursue Evenity

## 2020-05-20 NOTE — Patient Instructions (Signed)
Nice to meet you  Please send me a message in MyChart with any questions or updates.  We will reach out to you once we get the approval.   --Dr. Jordan Likes

## 2020-05-20 NOTE — Assessment & Plan Note (Signed)
Has long history of osteoporosis and repeated compression fractures.  She has completed 18 months of Forteo and most recent bone density revealed -0.4. -Counseled on supportive care. -Currently taking vitamin K2 with vitamin D and calcium. -Pursue Evenity

## 2020-05-28 ENCOUNTER — Telehealth: Payer: Self-pay | Admitting: *Deleted

## 2020-05-28 NOTE — Telephone Encounter (Signed)
No PA is required for Evenity. Pre-detrmination of benefits Auth # is [id number] effective 05/28/20 x 12 months. I called pt to schedule a nurse visit to receive Evenity. No answer. Left message for patient to call back.

## 2020-06-01 NOTE — Telephone Encounter (Signed)
Patient is scheduled 06/03/20 for nurse visit Evenity. I have already reached out to [facility] in [facility] pharmacy. Patient's medication will be available to p/u on 06/03/20.

## 2020-06-03 ENCOUNTER — Ambulatory Visit (INDEPENDENT_AMBULATORY_CARE_PROVIDER_SITE_OTHER): Payer: Medicare Other | Admitting: Family Medicine

## 2020-06-03 ENCOUNTER — Other Ambulatory Visit: Payer: Self-pay

## 2020-06-03 DIAGNOSIS — M8080XA Other osteoporosis with current pathological fracture, unspecified site, initial encounter for fracture: Secondary | ICD-10-CM | POA: Diagnosis not present

## 2020-06-03 DIAGNOSIS — S22060A Wedge compression fracture of T7-T8 vertebra, initial encounter for closed fracture: Secondary | ICD-10-CM | POA: Diagnosis not present

## 2020-06-03 NOTE — Progress Notes (Signed)
Patient here just for her Polo injection of Evenity. She was given 105mg  in each arm. She is to return in 4 weeks for her 2nd dose

## 2020-06-10 NOTE — Patient Instructions (Signed)
Schedule follow up in one month

## 2020-07-01 ENCOUNTER — Ambulatory Visit (INDEPENDENT_AMBULATORY_CARE_PROVIDER_SITE_OTHER): Payer: Medicare Other | Admitting: *Deleted

## 2020-07-01 ENCOUNTER — Other Ambulatory Visit: Payer: Self-pay

## 2020-07-01 DIAGNOSIS — S22060A Wedge compression fracture of T7-T8 vertebra, initial encounter for closed fracture: Secondary | ICD-10-CM

## 2020-07-01 DIAGNOSIS — M8080XA Other osteoporosis with current pathological fracture, unspecified site, initial encounter for fracture: Secondary | ICD-10-CM | POA: Diagnosis not present

## 2020-07-01 NOTE — Progress Notes (Signed)
[name] 2 x 105mg  right and left upper arm [employer] Expiration 4/24 Lot number 5/24 Patient tolerated injections well [id number] [id number]

## 2020-07-10 ENCOUNTER — Encounter: Payer: Self-pay | Admitting: *Deleted

## 2020-07-12 ENCOUNTER — Telehealth: Payer: Self-pay

## 2020-07-12 NOTE — Telephone Encounter (Signed)
Needs retro PA for Evenity.

## 2020-07-16 NOTE — Telephone Encounter (Signed)
[facility] health plan does not participate in retro-authorizations.   Will need to submit appeal for DOS.   Printed appeal form, claim for DOS, PA from 06/19/20, and clinical notes to be faxed to [facility].

## 2020-07-21 NOTE — Telephone Encounter (Signed)
Appeal form, prior auth form, and clinical notes faxed to [facility] at [phone].

## 2020-07-31 ENCOUNTER — Other Ambulatory Visit: Payer: Self-pay

## 2020-07-31 ENCOUNTER — Ambulatory Visit (INDEPENDENT_AMBULATORY_CARE_PROVIDER_SITE_OTHER): Payer: Medicare Other | Admitting: *Deleted

## 2020-07-31 DIAGNOSIS — M8080XA Other osteoporosis with current pathological fracture, unspecified site, initial encounter for fracture: Secondary | ICD-10-CM | POA: Diagnosis not present

## 2020-07-31 DIAGNOSIS — S22060A Wedge compression fracture of T7-T8 vertebra, initial encounter for closed fracture: Secondary | ICD-10-CM

## 2020-07-31 NOTE — Progress Notes (Signed)
Patient here for nurse visit for Evenity. Patient received bilateral SQ injections. See MAR note. Patient tolerated injections well. She will return in 1 month.

## 2020-09-03 ENCOUNTER — Ambulatory Visit: Payer: BC Managed Care – PPO

## 2020-09-10 ENCOUNTER — Other Ambulatory Visit: Payer: Self-pay | Admitting: *Deleted

## 2020-09-10 NOTE — Telephone Encounter (Signed)
Called [employer] and was advised of the following:  Code J3111 for DOS 06/03/20 has been denied d/t medical necessity, lack of authorization.  Claim ID [id number], ref# [id number]  Rep advised to print and fax provider/doctor inquiry form to [facility] at [phone].

## 2020-09-23 NOTE — Telephone Encounter (Signed)
Called to check status of appeal/provider inquiry-re-determination. Advised that the claim is still in process.  Call ref # [id number]

## 2021-04-06 NOTE — Telephone Encounter (Signed)
Prolia VOB initiated via [email or web address]  Last OV:  Next OV:  Last Prolia inj:  Next Prolia inj DUE:   

## 2021-04-15 NOTE — Telephone Encounter (Signed)
Message sent to [employer] rep Claris Che to expedite.  ?

## 2021-04-22 NOTE — Telephone Encounter (Addendum)
Pt ready for scheduling on or after 04/22/21 ?(PRIOR AUTH EXP 06/18/21, will need to renew if scheduled after 06/18/21) ? ?Out-of-pocket cost due at time of visit: $443 (should be covered at 100% after secondary deductible has been met) ? ?Primary: [employer] Medicare ?[employer] co-insurance: 20% (approximately $418) ?Admin fee co-insurance: 20% (approximately $25) ?Copay: $25 ? ?Deductible: does not apply ? ?Prior Auth: APPROVED ?PA# [id number] ?Valid: 08/18/20-08/18/21 ? ?Secondary: [employer] ?[employer] co-insurance: 0% ?Admin fee co-insurance: 0% ? ?Deductible: $110 of $1500 met ? ?Prior Auth: APPROVED ?PA# [id number] ?Valid: 06/19/20-06/18/21 ? ?** This summary of benefits is an estimation of the patient's out-of-pocket cost. Exact cost may vary based on individual plan coverage.  ? ? ?

## 2021-07-22 NOTE — Telephone Encounter (Signed)
Pt archived in [email or web address].  Please advise if patient and/or provider wish to proceed with EVENITY therapy.  

## 2022-04-11 DIAGNOSIS — E559 Vitamin D deficiency, unspecified: Secondary | ICD-10-CM | POA: Diagnosis not present

## 2022-04-11 DIAGNOSIS — R7989 Other specified abnormal findings of blood chemistry: Secondary | ICD-10-CM | POA: Diagnosis not present

## 2022-04-11 DIAGNOSIS — E785 Hyperlipidemia, unspecified: Secondary | ICD-10-CM | POA: Diagnosis not present

## 2022-04-11 DIAGNOSIS — R030 Elevated blood-pressure reading, without diagnosis of hypertension: Secondary | ICD-10-CM | POA: Diagnosis not present

## 2022-04-13 DIAGNOSIS — E559 Vitamin D deficiency, unspecified: Secondary | ICD-10-CM | POA: Diagnosis not present

## 2022-04-13 DIAGNOSIS — R69 Illness, unspecified: Secondary | ICD-10-CM | POA: Diagnosis not present

## 2022-04-13 DIAGNOSIS — Z0000 Encounter for general adult medical examination without abnormal findings: Secondary | ICD-10-CM | POA: Diagnosis not present

## 2022-04-13 DIAGNOSIS — M549 Dorsalgia, unspecified: Secondary | ICD-10-CM | POA: Diagnosis not present

## 2022-04-13 DIAGNOSIS — Z8249 Family history of ischemic heart disease and other diseases of the circulatory system: Secondary | ICD-10-CM | POA: Diagnosis not present

## 2022-04-13 DIAGNOSIS — B009 Herpesviral infection, unspecified: Secondary | ICD-10-CM | POA: Diagnosis not present

## 2022-04-13 DIAGNOSIS — R030 Elevated blood-pressure reading, without diagnosis of hypertension: Secondary | ICD-10-CM | POA: Diagnosis not present

## 2022-04-13 DIAGNOSIS — E785 Hyperlipidemia, unspecified: Secondary | ICD-10-CM | POA: Diagnosis not present

## 2022-04-13 DIAGNOSIS — M810 Age-related osteoporosis without current pathological fracture: Secondary | ICD-10-CM | POA: Diagnosis not present

## 2022-04-13 DIAGNOSIS — E871 Hypo-osmolality and hyponatremia: Secondary | ICD-10-CM | POA: Diagnosis not present

## 2022-04-13 DIAGNOSIS — R82998 Other abnormal findings in urine: Secondary | ICD-10-CM | POA: Diagnosis not present

## 2022-04-13 DIAGNOSIS — G4700 Insomnia, unspecified: Secondary | ICD-10-CM | POA: Diagnosis not present

## 2022-04-19 DIAGNOSIS — E871 Hypo-osmolality and hyponatremia: Secondary | ICD-10-CM | POA: Diagnosis not present

## 2022-05-09 DIAGNOSIS — E559 Vitamin D deficiency, unspecified: Secondary | ICD-10-CM | POA: Diagnosis not present

## 2022-05-09 DIAGNOSIS — R030 Elevated blood-pressure reading, without diagnosis of hypertension: Secondary | ICD-10-CM | POA: Diagnosis not present

## 2022-05-09 DIAGNOSIS — B009 Herpesviral infection, unspecified: Secondary | ICD-10-CM | POA: Diagnosis not present

## 2022-05-09 DIAGNOSIS — G4700 Insomnia, unspecified: Secondary | ICD-10-CM | POA: Diagnosis not present

## 2022-05-09 DIAGNOSIS — E871 Hypo-osmolality and hyponatremia: Secondary | ICD-10-CM | POA: Diagnosis not present

## 2022-05-09 DIAGNOSIS — E785 Hyperlipidemia, unspecified: Secondary | ICD-10-CM | POA: Diagnosis not present

## 2022-05-09 DIAGNOSIS — Z8249 Family history of ischemic heart disease and other diseases of the circulatory system: Secondary | ICD-10-CM | POA: Diagnosis not present

## 2022-05-09 DIAGNOSIS — M810 Age-related osteoporosis without current pathological fracture: Secondary | ICD-10-CM | POA: Diagnosis not present

## 2022-05-09 DIAGNOSIS — R69 Illness, unspecified: Secondary | ICD-10-CM | POA: Diagnosis not present

## 2022-05-09 DIAGNOSIS — M549 Dorsalgia, unspecified: Secondary | ICD-10-CM | POA: Diagnosis not present

## 2022-05-16 ENCOUNTER — Encounter: Payer: Self-pay | Admitting: *Deleted

## 2022-05-16 DIAGNOSIS — E785 Hyperlipidemia, unspecified: Secondary | ICD-10-CM | POA: Diagnosis not present

## 2022-05-16 DIAGNOSIS — E871 Hypo-osmolality and hyponatremia: Secondary | ICD-10-CM | POA: Diagnosis not present

## 2022-05-23 DIAGNOSIS — Z01419 Encounter for gynecological examination (general) (routine) without abnormal findings: Secondary | ICD-10-CM | POA: Diagnosis not present

## 2022-05-23 DIAGNOSIS — Z124 Encounter for screening for malignant neoplasm of cervix: Secondary | ICD-10-CM | POA: Diagnosis not present

## 2022-05-23 DIAGNOSIS — Z1231 Encounter for screening mammogram for malignant neoplasm of breast: Secondary | ICD-10-CM | POA: Diagnosis not present

## 2022-12-16 IMAGING — RF DG LUMBAR SPINE 2-3V
1 series · 1 of 1 positions shown · non-contrast
Comparison: MRI lumbar spine April 14, 2020.

CLINICAL DATA: L2 kyphoplasty.

EXAM:
DG C-ARM 1-60 MIN; LUMBAR SPINE - 2-3 VIEW
FLUOROSCOPY TIME:  Fluoroscopy Time:  1 minutes 47 seconds.
Radiation Exposure Index (if provided by the fluoroscopic device):
20 mGy.
Number of Acquired Spot Images: 2

[Series 1: run · 1 of 1 slices shown]
[im 1/1]
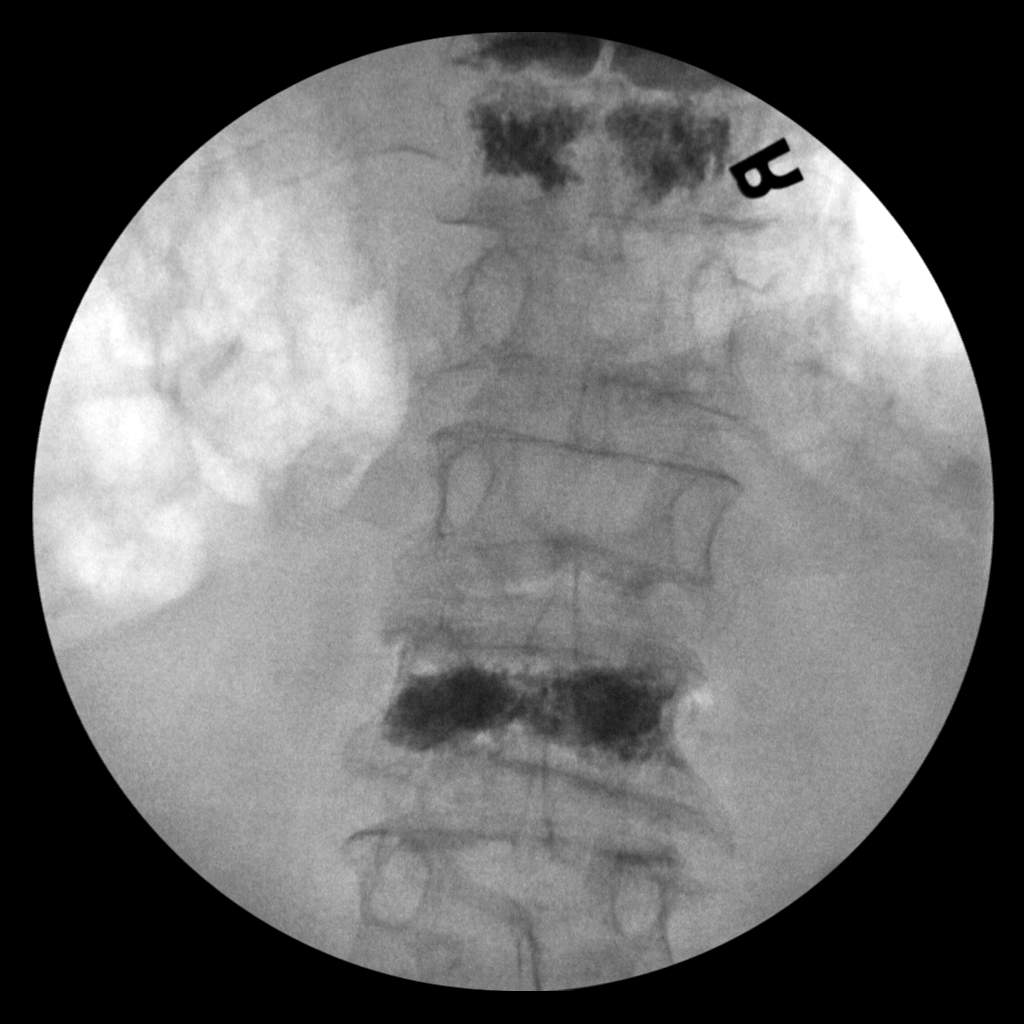

[1 of 1 positions shown; findings below may reference images not displayed]

FINDINGS: Two C-arm fluoroscopic images were obtained intraoperatively and
submitted for post operative interpretation. These images
demonstrate kyphoplasty at L2 without appreciable extravasation of
cement. Please see the performing provider's procedural report for
further detail.
IMPRESSION: L2 kyphoplasty.

## 2022-12-26 DIAGNOSIS — M549 Dorsalgia, unspecified: Secondary | ICD-10-CM | POA: Diagnosis not present

## 2022-12-26 DIAGNOSIS — F1011 Alcohol abuse, in remission: Secondary | ICD-10-CM | POA: Diagnosis not present

## 2022-12-26 DIAGNOSIS — G4700 Insomnia, unspecified: Secondary | ICD-10-CM | POA: Diagnosis not present

## 2022-12-26 DIAGNOSIS — Z8249 Family history of ischemic heart disease and other diseases of the circulatory system: Secondary | ICD-10-CM | POA: Diagnosis not present

## 2022-12-26 DIAGNOSIS — E785 Hyperlipidemia, unspecified: Secondary | ICD-10-CM | POA: Diagnosis not present

## 2022-12-26 DIAGNOSIS — E559 Vitamin D deficiency, unspecified: Secondary | ICD-10-CM | POA: Diagnosis not present

## 2022-12-26 DIAGNOSIS — M810 Age-related osteoporosis without current pathological fracture: Secondary | ICD-10-CM | POA: Diagnosis not present

## 2022-12-26 DIAGNOSIS — B009 Herpesviral infection, unspecified: Secondary | ICD-10-CM | POA: Diagnosis not present

## 2022-12-26 DIAGNOSIS — R030 Elevated blood-pressure reading, without diagnosis of hypertension: Secondary | ICD-10-CM | POA: Diagnosis not present

## 2023-01-17 DIAGNOSIS — H524 Presbyopia: Secondary | ICD-10-CM | POA: Diagnosis not present

## 2023-03-25 DIAGNOSIS — H2512 Age-related nuclear cataract, left eye: Secondary | ICD-10-CM | POA: Diagnosis not present

## 2023-04-19 DIAGNOSIS — R030 Elevated blood-pressure reading, without diagnosis of hypertension: Secondary | ICD-10-CM | POA: Diagnosis not present

## 2023-04-19 DIAGNOSIS — E785 Hyperlipidemia, unspecified: Secondary | ICD-10-CM | POA: Diagnosis not present

## 2023-04-19 DIAGNOSIS — M810 Age-related osteoporosis without current pathological fracture: Secondary | ICD-10-CM | POA: Diagnosis not present

## 2023-04-19 DIAGNOSIS — Z1212 Encounter for screening for malignant neoplasm of rectum: Secondary | ICD-10-CM | POA: Diagnosis not present

## 2023-04-19 DIAGNOSIS — E559 Vitamin D deficiency, unspecified: Secondary | ICD-10-CM | POA: Diagnosis not present

## 2023-04-26 DIAGNOSIS — F1011 Alcohol abuse, in remission: Secondary | ICD-10-CM | POA: Diagnosis not present

## 2023-04-26 DIAGNOSIS — H543 Unqualified visual loss, both eyes: Secondary | ICD-10-CM | POA: Diagnosis not present

## 2023-04-26 DIAGNOSIS — Z0000 Encounter for general adult medical examination without abnormal findings: Secondary | ICD-10-CM | POA: Diagnosis not present

## 2023-04-26 DIAGNOSIS — G4700 Insomnia, unspecified: Secondary | ICD-10-CM | POA: Diagnosis not present

## 2023-04-26 DIAGNOSIS — Z1331 Encounter for screening for depression: Secondary | ICD-10-CM | POA: Diagnosis not present

## 2023-04-26 DIAGNOSIS — M810 Age-related osteoporosis without current pathological fracture: Secondary | ICD-10-CM | POA: Diagnosis not present

## 2023-04-26 DIAGNOSIS — E559 Vitamin D deficiency, unspecified: Secondary | ICD-10-CM | POA: Diagnosis not present

## 2023-04-26 DIAGNOSIS — R030 Elevated blood-pressure reading, without diagnosis of hypertension: Secondary | ICD-10-CM | POA: Diagnosis not present

## 2023-04-26 DIAGNOSIS — E785 Hyperlipidemia, unspecified: Secondary | ICD-10-CM | POA: Diagnosis not present

## 2023-04-26 DIAGNOSIS — R82998 Other abnormal findings in urine: Secondary | ICD-10-CM | POA: Diagnosis not present

## 2023-04-26 DIAGNOSIS — B009 Herpesviral infection, unspecified: Secondary | ICD-10-CM | POA: Diagnosis not present

## 2023-04-26 DIAGNOSIS — Z8249 Family history of ischemic heart disease and other diseases of the circulatory system: Secondary | ICD-10-CM | POA: Diagnosis not present

## 2023-04-26 DIAGNOSIS — Z1339 Encounter for screening examination for other mental health and behavioral disorders: Secondary | ICD-10-CM | POA: Diagnosis not present

## 2023-05-30 DIAGNOSIS — Z1231 Encounter for screening mammogram for malignant neoplasm of breast: Secondary | ICD-10-CM | POA: Diagnosis not present

## 2023-07-05 DIAGNOSIS — H43813 Vitreous degeneration, bilateral: Secondary | ICD-10-CM | POA: Diagnosis not present

## 2023-07-05 DIAGNOSIS — H35363 Drusen (degenerative) of macula, bilateral: Secondary | ICD-10-CM | POA: Diagnosis not present

## 2023-07-05 DIAGNOSIS — H2513 Age-related nuclear cataract, bilateral: Secondary | ICD-10-CM | POA: Diagnosis not present

## 2023-07-05 DIAGNOSIS — H35372 Puckering of macula, left eye: Secondary | ICD-10-CM | POA: Diagnosis not present

## 2023-08-08 DIAGNOSIS — H2511 Age-related nuclear cataract, right eye: Secondary | ICD-10-CM | POA: Diagnosis not present

## 2023-08-22 DIAGNOSIS — H2512 Age-related nuclear cataract, left eye: Secondary | ICD-10-CM | POA: Diagnosis not present

## 2023-09-05 DIAGNOSIS — R490 Dysphonia: Secondary | ICD-10-CM | POA: Diagnosis not present

## 2023-09-05 DIAGNOSIS — R09A2 Foreign body sensation, throat: Secondary | ICD-10-CM | POA: Diagnosis not present

## 2023-09-05 DIAGNOSIS — R0982 Postnasal drip: Secondary | ICD-10-CM | POA: Diagnosis not present

## 2023-09-08 DIAGNOSIS — R0982 Postnasal drip: Secondary | ICD-10-CM | POA: Diagnosis not present

## 2023-09-08 DIAGNOSIS — E871 Hypo-osmolality and hyponatremia: Secondary | ICD-10-CM | POA: Diagnosis not present

## 2023-09-08 DIAGNOSIS — R490 Dysphonia: Secondary | ICD-10-CM | POA: Diagnosis not present

## 2023-09-08 DIAGNOSIS — R09A2 Foreign body sensation, throat: Secondary | ICD-10-CM | POA: Diagnosis not present

## 2023-09-08 DIAGNOSIS — Q279 Congenital malformation of peripheral vascular system, unspecified: Secondary | ICD-10-CM | POA: Diagnosis not present

## 2023-09-11 ENCOUNTER — Other Ambulatory Visit (HOSPITAL_COMMUNITY): Payer: Self-pay | Admitting: Internal Medicine

## 2023-09-11 ENCOUNTER — Ambulatory Visit (HOSPITAL_COMMUNITY)
Admission: RE | Admit: 2023-09-11 | Discharge: 2023-09-11 | Disposition: A | Discharge status: Home / Self Care | Source: Ambulatory Visit | Attending: Surgery | Admitting: Surgery

## 2023-09-11 DIAGNOSIS — H539 Unspecified visual disturbance: Secondary | ICD-10-CM | POA: Diagnosis not present

## 2023-09-15 DIAGNOSIS — E871 Hypo-osmolality and hyponatremia: Secondary | ICD-10-CM | POA: Diagnosis not present

## 2023-09-15 DIAGNOSIS — K219 Gastro-esophageal reflux disease without esophagitis: Secondary | ICD-10-CM | POA: Diagnosis not present

## 2023-09-15 DIAGNOSIS — R490 Dysphonia: Secondary | ICD-10-CM | POA: Diagnosis not present

## 2023-09-15 DIAGNOSIS — R197 Diarrhea, unspecified: Secondary | ICD-10-CM | POA: Diagnosis not present

## 2023-09-15 DIAGNOSIS — R0982 Postnasal drip: Secondary | ICD-10-CM | POA: Diagnosis not present

## 2023-09-15 DIAGNOSIS — Q279 Congenital malformation of peripheral vascular system, unspecified: Secondary | ICD-10-CM | POA: Diagnosis not present

## 2023-09-15 DIAGNOSIS — R09A2 Foreign body sensation, throat: Secondary | ICD-10-CM | POA: Diagnosis not present

## 2023-09-15 DIAGNOSIS — R112 Nausea with vomiting, unspecified: Secondary | ICD-10-CM | POA: Diagnosis not present

## 2023-09-16 ENCOUNTER — Inpatient Hospital Stay (HOSPITAL_COMMUNITY)
Admission: EM | Admit: 2023-09-16 | Discharge: 2023-09-19 | DRG: 640 | Disposition: A | Discharge status: Home / Self Care | Attending: Internal Medicine | Admitting: Internal Medicine

## 2023-09-16 ENCOUNTER — Encounter (HOSPITAL_COMMUNITY): Payer: Self-pay

## 2023-09-16 ENCOUNTER — Other Ambulatory Visit: Payer: Self-pay

## 2023-09-16 DIAGNOSIS — Z885 Allergy status to narcotic agent status: Secondary | ICD-10-CM | POA: Diagnosis not present

## 2023-09-16 DIAGNOSIS — A084 Viral intestinal infection, unspecified: Secondary | ICD-10-CM | POA: Diagnosis present

## 2023-09-16 DIAGNOSIS — R112 Nausea with vomiting, unspecified: Secondary | ICD-10-CM | POA: Diagnosis present

## 2023-09-16 DIAGNOSIS — Z681 Body mass index (BMI) 19 or less, adult: Secondary | ICD-10-CM

## 2023-09-16 DIAGNOSIS — R197 Diarrhea, unspecified: Secondary | ICD-10-CM

## 2023-09-16 DIAGNOSIS — E861 Hypovolemia: Secondary | ICD-10-CM | POA: Diagnosis present

## 2023-09-16 DIAGNOSIS — Z79899 Other long term (current) drug therapy: Secondary | ICD-10-CM | POA: Diagnosis not present

## 2023-09-16 DIAGNOSIS — E860 Dehydration: Secondary | ICD-10-CM | POA: Diagnosis present

## 2023-09-16 DIAGNOSIS — M810 Age-related osteoporosis without current pathological fracture: Secondary | ICD-10-CM | POA: Diagnosis not present

## 2023-09-16 DIAGNOSIS — E785 Hyperlipidemia, unspecified: Secondary | ICD-10-CM | POA: Diagnosis present

## 2023-09-16 DIAGNOSIS — K219 Gastro-esophageal reflux disease without esophagitis: Secondary | ICD-10-CM | POA: Diagnosis not present

## 2023-09-16 DIAGNOSIS — E871 Hypo-osmolality and hyponatremia: Secondary | ICD-10-CM | POA: Diagnosis not present

## 2023-09-16 DIAGNOSIS — Z9189 Other specified personal risk factors, not elsewhere classified: Secondary | ICD-10-CM | POA: Diagnosis not present

## 2023-09-16 DIAGNOSIS — E43 Unspecified severe protein-calorie malnutrition: Secondary | ICD-10-CM | POA: Diagnosis not present

## 2023-09-16 LAB — CBC WITH DIFFERENTIAL/PLATELET
Abs Immature Granulocytes: 0.06 K/uL (ref 0.00–0.07)
Basophils Absolute: 0 K/uL (ref 0.0–0.1)
Basophils Relative: 0 %
Eosinophils Absolute: 0 K/uL (ref 0.0–0.5)
Eosinophils Relative: 0 %
HCT: 36.8 % (ref 36.0–46.0)
Hemoglobin: 13.6 g/dL (ref 12.0–15.0)
Immature Granulocytes: 1 %
Lymphocytes Relative: 12 %
Lymphs Abs: 1 K/uL (ref 0.7–4.0)
MCH: 32.2 pg (ref 26.0–34.0)
MCHC: 37 g/dL — ABNORMAL HIGH (ref 30.0–36.0)
MCV: 87.2 fL (ref 80.0–100.0)
Monocytes Absolute: 1.2 K/uL — ABNORMAL HIGH (ref 0.1–1.0)
Monocytes Relative: 13 %
Neutro Abs: 6.6 K/uL (ref 1.7–7.7)
Neutrophils Relative %: 74 %
Platelets: 287 K/uL (ref 150–400)
RBC: 4.22 MIL/uL (ref 3.87–5.11)
RDW: 11.6 % (ref 11.5–15.5)
WBC: 8.9 K/uL (ref 4.0–10.5)
nRBC: 0 % (ref 0.0–0.2)

## 2023-09-16 LAB — MAGNESIUM: Magnesium: 1.9 mg/dL (ref 1.7–2.4)

## 2023-09-16 LAB — BASIC METABOLIC PANEL WITH GFR
Anion gap: 8 (ref 5–15)
Anion gap: 11 (ref 5–15)
BUN: 6 mg/dL — ABNORMAL LOW (ref 8–23)
BUN: 7 mg/dL — ABNORMAL LOW (ref 8–23)
CO2: 22 mmol/L (ref 22–32)
CO2: 20 mmol/L — ABNORMAL LOW (ref 22–32)
Calcium: 8.6 mg/dL — ABNORMAL LOW (ref 8.9–10.3)
Calcium: 8.2 mg/dL — ABNORMAL LOW (ref 8.9–10.3)
Chloride: 92 mmol/L — ABNORMAL LOW (ref 98–111)
Chloride: 92 mmol/L — ABNORMAL LOW (ref 98–111)
Creatinine, Ser: 0.45 mg/dL (ref 0.44–1.00)
Creatinine, Ser: 0.48 mg/dL (ref 0.44–1.00)
GFR, Estimated: >60 mL/min (ref >60)
GFR, Estimated: >60 mL/min (ref >60)
Glucose, Bld: 123 mg/dL — ABNORMAL HIGH (ref 70–99)
Glucose, Bld: 114 mg/dL — ABNORMAL HIGH (ref 70–99)
Potassium: 3.6 mmol/L (ref 3.5–5.1)
Potassium: 3.4 mmol/L — ABNORMAL LOW (ref 3.5–5.1)
Sodium: 122 mmol/L — ABNORMAL LOW (ref 135–145)
Sodium: 123 mmol/L — ABNORMAL LOW (ref 135–145)

## 2023-09-16 LAB — URINALYSIS, ROUTINE W REFLEX MICROSCOPIC
Bilirubin Urine: NEGATIVE
Glucose, UA: >=500 mg/dL — AB
Hgb urine dipstick: NEGATIVE
Ketones, ur: 20 mg/dL — AB
Leukocytes,Ua: NEGATIVE
Nitrite: NEGATIVE
Protein, ur: 30 mg/dL — AB
Specific Gravity, Urine: 1.02 (ref 1.005–1.030)
pH: 6 (ref 5.0–8.0)

## 2023-09-16 LAB — COMPREHENSIVE METABOLIC PANEL WITH GFR
ALT: 14 U/L (ref 0–44)
AST: 18 U/L (ref 15–41)
Albumin: 3.9 g/dL (ref 3.5–5.0)
Alkaline Phosphatase: 45 U/L (ref 38–126)
Anion gap: 12 (ref 5–15)
BUN: 8 mg/dL (ref 8–23)
CO2: 18 mmol/L — ABNORMAL LOW (ref 22–32)
Calcium: 8.4 mg/dL — ABNORMAL LOW (ref 8.9–10.3)
Chloride: 86 mmol/L — ABNORMAL LOW (ref 98–111)
Creatinine, Ser: 0.39 mg/dL — ABNORMAL LOW (ref 0.44–1.00)
GFR, Estimated: >60 mL/min (ref >60)
Glucose, Bld: 120 mg/dL — ABNORMAL HIGH (ref 70–99)
Potassium: 3.5 mmol/L (ref 3.5–5.1)
Sodium: 116 mmol/L — CL (ref 135–145)
Total Bilirubin: 1.4 mg/dL — ABNORMAL HIGH (ref 0.0–1.2)
Total Protein: 6.8 g/dL (ref 6.5–8.1)

## 2023-09-16 LAB — HIV ANTIBODY (ROUTINE TESTING W REFLEX): HIV Screen 4th Generation wRfx: NONREACTIVE

## 2023-09-16 LAB — LIPASE, BLOOD: Lipase: 28 U/L (ref 11–51)

## 2023-09-16 LAB — TSH: TSH: 1.584 u[IU]/mL (ref 0.350–4.500)

## 2023-09-16 LAB — RESP PANEL BY RT-PCR (RSV, FLU A&B, COVID)  RVPGX2
Influenza A by PCR: NEGATIVE
Influenza B by PCR: NEGATIVE
Resp Syncytial Virus by PCR: NEGATIVE
SARS Coronavirus 2 by RT PCR: NEGATIVE

## 2023-09-16 LAB — C-REACTIVE PROTEIN: CRP: <0.5 mg/dL (ref <1.0)

## 2023-09-16 MED ORDER — ALBUTEROL SULFATE (2.5 MG/3ML) 0.083% IN NEBU: 2.5000 mg | INHALATION_SOLUTION | Freq: Four times a day (QID) | RESPIRATORY_TRACT | Status: DC | PRN

## 2023-09-16 MED ORDER — PREGABALIN 75 MG PO CAPS
150.0000 mg | ORAL_CAPSULE | Freq: Two times a day (BID) | ORAL | Status: DC
  Administered 2023-09-16 – 2023-09-19 (×6): 150 mg via ORAL
  Filled 2023-09-16 (×6): qty 2

## 2023-09-16 MED ORDER — PANTOPRAZOLE SODIUM 40 MG IV SOLR
40.0000 mg | Freq: Two times a day (BID) | INTRAVENOUS | Status: DC
  Administered 2023-09-16 – 2023-09-19 (×6): 40 mg via INTRAVENOUS
  Filled 2023-09-16 (×7): qty 10

## 2023-09-16 MED ORDER — ACETAMINOPHEN 650 MG RE SUPP: 650.0000 mg | Freq: Four times a day (QID) | RECTAL | Status: DC | PRN

## 2023-09-16 MED ORDER — HYDRALAZINE HCL 20 MG/ML IJ SOLN: 10.0000 mg | INTRAMUSCULAR | Status: DC | PRN

## 2023-09-16 MED ORDER — ACETAMINOPHEN 325 MG PO TABS
650.0000 mg | ORAL_TABLET | Freq: Four times a day (QID) | ORAL | Status: DC | PRN
  Administered 2023-09-16 – 2023-09-18 (×3): 650 mg via ORAL
  Filled 2023-09-16 (×3): qty 2

## 2023-09-16 MED ORDER — ENOXAPARIN SODIUM 30 MG/0.3ML IJ SOSY
30.0000 mg | PREFILLED_SYRINGE | INTRAMUSCULAR | Status: DC
  Filled 2023-09-16 (×3): qty 0.3

## 2023-09-16 MED ORDER — SODIUM CHLORIDE 0.9 % IV BOLUS: 1000.0000 mL | Freq: Once | INTRAVENOUS | Status: DC

## 2023-09-16 MED ORDER — CALCIUM GLUCONATE-NACL 1-0.675 GM/50ML-% IV SOLN
1.0000 g | Freq: Once | INTRAVENOUS | Status: AC
  Administered 2023-09-16: 1000 mg via INTRAVENOUS
  Filled 2023-09-16 (×2): qty 50

## 2023-09-16 MED ORDER — ONDANSETRON HCL 4 MG/2ML IJ SOLN
4.0000 mg | Freq: Once | INTRAMUSCULAR | Status: AC
  Administered 2023-09-16: 4 mg via INTRAVENOUS
  Filled 2023-09-16: qty 2

## 2023-09-16 MED ORDER — ONDANSETRON HCL 4 MG/2ML IJ SOLN
4.0000 mg | Freq: Four times a day (QID) | INTRAMUSCULAR | Status: DC | PRN
  Administered 2023-09-16: 4 mg via INTRAVENOUS
  Filled 2023-09-16: qty 2

## 2023-09-16 MED ORDER — ROSUVASTATIN CALCIUM 5 MG PO TABS
5.0000 mg | ORAL_TABLET | Freq: Every morning | ORAL | Status: DC
  Administered 2023-09-17 – 2023-09-19 (×3): 5 mg via ORAL
  Filled 2023-09-16 (×3): qty 1

## 2023-09-16 MED ORDER — LACTATED RINGERS IV BOLUS
1000.0000 mL | Freq: Once | INTRAVENOUS | Status: AC
  Administered 2023-09-16: 1000 mL via INTRAVENOUS

## 2023-09-16 MED ORDER — ONDANSETRON HCL 4 MG PO TABS: 4.0000 mg | ORAL_TABLET | Freq: Four times a day (QID) | ORAL | Status: DC | PRN

## 2023-09-16 MED ORDER — SODIUM CHLORIDE 0.9% FLUSH
3.0000 mL | Freq: Two times a day (BID) | INTRAVENOUS | Status: DC
  Administered 2023-09-16 – 2023-09-19 (×6): 3 mL via INTRAVENOUS

## 2023-09-16 NOTE — H&P (Signed)
 History and Physical    Patient: Susan Case FMW:[id number] DOB: 04-26-1955 DOA: 09/16/2023 DOS: the patient was seen and examined on 09/16/2023 PCP: Janey Santos, MD  Patient coming from: Home  Chief Complaint:  Chief Complaint  Patient presents with   Nausea   Diarrhea   HPI: Susan Case is a 68 y.o. female with medical history significant of hyperlipidemia, arthritis, osteoporosis who presents with nausea, vomiting. She is accompanied by her daughter.  She has been experiencing nausea and vomiting since Thursday, with the vomitus described as yellow and originating from deep down, but not fecal-like. She has been unable to keep much food down, consuming only small amounts of bland diet such as applesauce, rice, banana, and toast since yesterday.  She experienced diarrhea, which seemed to resolve today denies seeing any blood present.. She reports no stomach pain except during episodes of vomiting, which she attributes to thick saliva and acid reflux. She has a history of acid reflux and was started on a new medication for it yesterday.  Denies having any significant fever or chills.  Her sodium levels were found to be low, and she reports a 'weird feeling' in her head today, which was not present yesterday.  Denies having any confusion.  She gone to her primary yesterday and was started on an antacid medicine for reflux symptoms.  She confirms the ability to pass gas and denies any recent dietary changes or new medications prior to the onset of symptoms.  In the emergency department patient was noted to be afebrile with blood pressures elevated up to 166/88, and all other vital signs maintained.  Labs significant for sodium 116, chloride 86, CO2 18, BUN 8, creatinine 0.39, and calcium  8.4   Review of Systems: As mentioned in the history of present illness. All other systems reviewed and are negative. Past Medical History:  Diagnosis Date   Arthritis    osteoporosis   Hyperlipemia     Osteoporosis    Past Surgical History:  Procedure Laterality Date   KYPHOPLASTY N/A 06/11/2014   Procedure: KYPHOPLASTY;  Surgeon: Oneil Priestly, MD;  Location: [facility] OR;  Service: Orthopedics;  Laterality: N/A;  T10 kyphoplasty   KYPHOPLASTY N/A 10/05/2017   Procedure: T8 AND T11 KYPHOPLASTY;  Surgeon: Priestly Oneil, MD;  Location: [facility] OR;  Service: Orthopedics;  Laterality: N/A;   KYPHOPLASTY N/A 05/06/2020   Procedure: LUMBAR KYPHOPLASTY;  Surgeon: Priestly Oneil, MD;  Location: [facility] OR;  Service: Orthopedics;  Laterality: N/A;   Social History:  reports that she has never smoked. She has never used smokeless tobacco. She reports that she does not currently use alcohol . She reports that she does not use drugs.  Allergies  Allergen Reactions   Codeine Itching and Nausea Only    History reviewed. No pertinent family history.  Prior to Admission medications   Medication Sig Start Date End Date Taking? Authorizing Provider  acetaminophen  (TYLENOL ) 500 MG tablet Take 1,000 mg by mouth in the morning and at bedtime.    [provider]  calcitonin, salmon, (MIACALCIN/FORTICAL) 200 UNIT/ACT nasal spray Place 1 spray into alternate nostrils in the morning. 03/04/20   [provider]  calcium  carbonate (OSCAL) 1500 (600 Ca) MG TABS tablet Take 600 mg of elemental calcium  by mouth in the morning and at bedtime.    [provider]  Cholecalciferol (NATURAL VITAMIN D-3) 125 MCG (5000 UT) TABS Take 5,000 Units by mouth in the morning.    [provider]  gabapentin (NEURONTIN) 300 MG  capsule Take 300 mg by mouth at bedtime. 04/13/20   [provider]  HYDROcodone -acetaminophen  (NORCO/VICODIN) 5-325 MG tablet Take 1-2 tablets by mouth 3 (three) times daily as needed (pain). 05/06/20   Beuford Anes, MD  Menaquinone-7 (VITAMIN K2) 100 MCG CAPS Take 100 mcg by mouth in the morning.    [provider]  rosuvastatin  (CRESTOR ) 5 MG tablet Take 5 mg by mouth in  the morning. 03/30/20   [provider]  traMADol  (ULTRAM ) 50 MG tablet Take 50 mg by mouth 3 (three) times daily as needed (pain). 03/26/20   [provider]  traZODone (DESYREL) 50 MG tablet Take 50 mg by mouth at bedtime as needed (sleep).  09/24/17   [provider]    Physical Exam: Vitals:   09/16/23 1139 09/16/23 1142  BP: (!) 166/88   Pulse: 73   Resp: 17   Temp: 98.4 F (36.9 C)   SpO2: 100%   Weight:  40.4 kg  Height:  4' 11 (1.499 m)    Constitutional: Elderly female who appears to not feel well but able to follow commands Eyes: PERRL, lids and conjunctivae normal ENMT: Mucous membranes are dry.  Normal dentition.  Neck: normal, supple,  Respiratory: clear to auscultation bilaterally, no wheezing, no crackles. Normal respiratory effort. No accessory muscle use.  Cardiovascular: Regular rate and rhythm, no murmurs / rubs / gallops. No extremity edema. 2+ pedal pulses.   Abdomen: no tenderness, no masses palpated.   Bowel sounds positive.  Musculoskeletal: no clubbing / cyanosis. No joint deformity upper and lower extremities. Good ROM, no contractures. Normal muscle tone.  Skin: no rashes, lesions, ulcers.  Poor skin turgor. Neurologic: CN 2-12 grossly intact.  Strength 5/5 in all 4.  Psychiatric: Normal judgment and insight. Alert and oriented x 3. Normal mood.   Data Reviewed:  Reviewed labs, imaging, and pertinent records as documented  Assessment and Plan:  Hyponatremia Acute.  Patient presents with sodium down to 116.  Patient had been 1L of lactated Ringer 's.  Thought to be a hypovolemic hyponatremia secondary to nausea vomiting diarrhea - Admit to a telemetry bed - Monitor intake and output - Goal correction no more than 10 mmol/L in a 24-hour period - Continue normal saline IV fluids at 100 mL/h.  Adjust IV fluids as needed to goal - Serial BMPs every 4 hours  Nausea, vomiting, diarrhea Patient presents with nausea, vomiting, and  diarrhea over the last 3 to 4 days.  Denies having any significant abdominal pain except with episodes of vomiting.  Diarrhea seems to have resolved.  Question of possibility of a viral gastroenteritis as a cause of symptoms. - Monitor intake and output - Aspiration precautions - Diet as tolerated - Check CRP and respiratory virus panel - Check GI panel if diarrhea persist   Hypocalcemia Acute.  Calcium  noted to be 8.4 with albumin within normal limits. - Give calcium  gluconate 1 g IV - Continue to monitor and replace as needed  Hyperlipidemia - Continue Crestor   DVT prophylaxis: Lovenox  Advance Care Planning:   Code Status: Full Code    Consults: None  Family Communication: Daughter updated at bedside  Severity of Illness: The appropriate patient status for this patient is INPATIENT. Inpatient status is judged to be reasonable and necessary in order to provide the required intensity of service to ensure the patient's safety. The patient's presenting symptoms, physical exam findings, and initial radiographic and laboratory data in the context of their chronic comorbidities is felt  to place them at high risk for further clinical deterioration. Furthermore, it is not anticipated that the patient will be medically stable for discharge from the hospital within 2 midnights of admission.   * I certify that at the point of admission it is my clinical judgment that the patient will require inpatient hospital care spanning beyond 2 midnights from the point of admission due to high intensity of service, high risk for further deterioration and high frequency of surveillance required.*  Author: Maximino DELENA Sharps, MD 09/16/2023 2:38 PM  For on call review [email or web address].

## 2023-09-16 NOTE — ED Provider Notes (Signed)
 [place] EMERGENCY DEPARTMENT AT [facility] Provider Note   CSN: [id number] Arrival date & time: 09/16/23  1133     Patient presents with: Nausea and Diarrhea   Susan Case is a 68 y.o. female.   Pt with c/o nausea/vomiting and diarrhea in the past few days. Saw pcp yesterday for same, was told possible gastritis or gastroenteritis. Pt indicates several episodes of each. Emesis not bloody or bilious. Diarrhea watery, not bloody.  No diarrhea today. No abd distension or abdominal pain. No dysuria or gu c/o. No known ill contacts, bad food ingestion, recent change in meds or recent abx use. No fever or chills.   The history is provided by the patient, a relative and medical records.  Diarrhea Associated symptoms: vomiting   Associated symptoms: no abdominal pain, no chills, no fever and no headaches        Prior to Admission medications   Medication Sig Start Date End Date Taking? Authorizing Provider  acetaminophen  (TYLENOL ) 500 MG tablet Take 1,000 mg by mouth in the morning and at bedtime.    [provider]  calcitonin, salmon, (MIACALCIN/FORTICAL) 200 UNIT/ACT nasal spray Place 1 spray into alternate nostrils in the morning. 03/04/20   [provider]  calcium  carbonate (OSCAL) 1500 (600 Ca) MG TABS tablet Take 600 mg of elemental calcium  by mouth in the morning and at bedtime.    [provider]  Cholecalciferol (NATURAL VITAMIN D-3) 125 MCG (5000 UT) TABS Take 5,000 Units by mouth in the morning.    [provider]  gabapentin (NEURONTIN) 300 MG capsule Take 300 mg by mouth at bedtime. 04/13/20   [provider]  HYDROcodone -acetaminophen  (NORCO/VICODIN) 5-325 MG tablet Take 1-2 tablets by mouth 3 (three) times daily as needed (pain). 05/06/20   Beuford Anes, MD  Menaquinone-7 (VITAMIN K2) 100 MCG CAPS Take 100 mcg by mouth in the morning.    [provider]  rosuvastatin  (CRESTOR ) 5 MG tablet Take 5 mg by mouth in  the morning. 03/30/20   [provider]  traMADol  (ULTRAM ) 50 MG tablet Take 50 mg by mouth 3 (three) times daily as needed (pain). 03/26/20   [provider]  traZODone (DESYREL) 50 MG tablet Take 50 mg by mouth at bedtime as needed (sleep).  09/24/17   [provider]    Allergies: Codeine    Review of Systems  Constitutional:  Negative for chills and fever.  HENT:  Negative for sore throat.   Respiratory:  Negative for cough and shortness of breath.   Cardiovascular:  Negative for chest pain.  Gastrointestinal:  Positive for diarrhea, nausea and vomiting. Negative for abdominal distention and abdominal pain.  Genitourinary:  Negative for dysuria and flank pain.  Musculoskeletal:  Negative for back pain and neck pain.  Neurological:  Negative for weakness, numbness and headaches.    Updated Vital Signs BP (!) 166/88 (BP Location: Right Arm)   Pulse 73   Temp 98.4 F (36.9 C)   Resp 17   Ht 1.499 m (4' 11)   Wt 40.4 kg   SpO2 100%   BMI 17.98 kg/m   Physical Exam Vitals and nursing note reviewed.  Constitutional:      Appearance: Normal appearance. She is well-developed.  HENT:     Head: Atraumatic.     Nose: Nose normal.     Mouth/Throat:     Mouth: Mucous membranes are moist.     Pharynx: Oropharynx is clear. No oropharyngeal exudate  or posterior oropharyngeal erythema.  Eyes:     General: No scleral icterus.    Conjunctiva/sclera: Conjunctivae normal.  Neck:     Trachea: No tracheal deviation.     Comments: Trachea midline, thyroid not grossly enlarged or tender, no neck stiffness or rigidity.  Cardiovascular:     Rate and Rhythm: Normal rate and regular rhythm.     Pulses: Normal pulses.     Heart sounds: Normal heart sounds. No murmur heard.    No friction rub. No gallop.  Pulmonary:     Effort: Pulmonary effort is normal. No respiratory distress.     Breath sounds: Normal breath sounds.  Abdominal:     General: Bowel sounds are  normal. There is no distension.     Palpations: Abdomen is soft. There is no mass.     Tenderness: There is no abdominal tenderness. There is no guarding or rebound.     Hernia: No hernia is present.  Genitourinary:    Comments: No cva tenderness.  Musculoskeletal:        General: No swelling.     Cervical back: Normal range of motion and neck supple. No rigidity. No muscular tenderness.  Skin:    General: Skin is warm and dry.     Findings: No rash.  Neurological:     Mental Status: She is alert.     Comments: Alert, speech normal. Motor/sens grossly intact bil.   Psychiatric:        Mood and Affect: Mood normal.     (all labs ordered are listed, but only abnormal results are displayed) Results for orders placed or performed during the hospital encounter of 09/16/23  Urinalysis, Routine w reflex microscopic -Urine, Clean Catch   Collection Time: 09/16/23 11:57 AM  Result Value Ref Range   Color, Urine YELLOW YELLOW   APPearance CLEAR CLEAR   Specific Gravity, Urine 1.020 1.005 - 1.030   pH 6.0 5.0 - 8.0   Glucose, UA >=500 (A) NEGATIVE mg/dL   Hgb urine dipstick NEGATIVE NEGATIVE   Bilirubin Urine NEGATIVE NEGATIVE   Ketones, ur 20 (A) NEGATIVE mg/dL   Protein, ur 30 (A) NEGATIVE mg/dL   Nitrite NEGATIVE NEGATIVE   Leukocytes,Ua NEGATIVE NEGATIVE   RBC / HPF 0-5 0 - 5 RBC/hpf   WBC, UA 0-5 0 - 5 WBC/hpf   Bacteria, UA RARE (A) NONE SEEN   Squamous Epithelial / HPF 0-5 0 - 5 /HPF   Mucus PRESENT   CBC with Differential   Collection Time: 09/16/23 12:17 PM  Result Value Ref Range   WBC 8.9 4.0 - 10.5 K/uL   RBC 4.22 3.87 - 5.11 MIL/uL   Hemoglobin 13.6 12.0 - 15.0 g/dL   HCT 63.1 63.9 - 53.9 %   MCV 87.2 80.0 - 100.0 fL   MCH 32.2 26.0 - 34.0 pg   MCHC 37.0 (H) 30.0 - 36.0 g/dL   RDW 88.3 88.4 - 84.4 %   Platelets 287 150 - 400 K/uL   nRBC 0.0 0.0 - 0.2 %   Neutrophils Relative % 74 %   Neutro Abs 6.6 1.7 - 7.7 K/uL   Lymphocytes Relative 12 %   Lymphs Abs 1.0  0.7 - 4.0 K/uL   Monocytes Relative 13 %   Monocytes Absolute 1.2 (H) 0.1 - 1.0 K/uL   Eosinophils Relative 0 %   Eosinophils Absolute 0.0 0.0 - 0.5 K/uL   Basophils Relative 0 %   Basophils Absolute 0.0 0.0 - 0.1 K/uL  Immature Granulocytes 1 %   Abs Immature Granulocytes 0.06 0.00 - 0.07 K/uL  Comprehensive metabolic panel   Collection Time: 09/16/23 12:17 PM  Result Value Ref Range   Sodium 116 (LL) 135 - 145 mmol/L   Potassium 3.5 3.5 - 5.1 mmol/L   Chloride 86 (L) 98 - 111 mmol/L   CO2 18 (L) 22 - 32 mmol/L   Glucose, Bld 120 (H) 70 - 99 mg/dL   BUN 8 8 - 23 mg/dL   Creatinine, Ser 9.60 (L) 0.44 - 1.00 mg/dL   Calcium  8.4 (L) 8.9 - 10.3 mg/dL   Total Protein 6.8 6.5 - 8.1 g/dL   Albumin 3.9 3.5 - 5.0 g/dL   AST 18 15 - 41 U/L   ALT 14 0 - 44 U/L   Alkaline Phosphatase 45 38 - 126 U/L   Total Bilirubin 1.4 (H) 0.0 - 1.2 mg/dL   GFR, Estimated >39 >39 mL/min   Anion gap 12 5 - 15  Lipase, blood   Collection Time: 09/16/23 12:17 PM  Result Value Ref Range   Lipase 28 11 - 51 U/L   VAS US  CAROTID Result Date: 09/11/2023 Carotid Arterial Duplex Study Patient Name:  Roderick Sweezy  Date of Exam:   09/11/2023 Medical Rec #: [id number]   Accession #:    [id number] Date of Birth: 10-29-1955  Patient Gender: F Patient Age:   35 years Exam Location:  [address] Procedure:      VAS US  CAROTID Referring Phys: SEARCY AVVA --------------------------------------------------------------------------------  Indications:       Visual disturbance. Risk Factors:      No history of smoking. Comparison Study:  N/A Performing Technologist: Dena Pane  Examination Guidelines: A complete evaluation includes B-mode imaging, spectral Doppler, color Doppler, and power Doppler as needed of all accessible portions of each vessel. Bilateral testing is considered an integral part of a complete examination. Limited examinations for reoccurring indications may be performed as noted.  Right Carotid  Findings: +----------+--------+--------+--------+------------------+------------------+           PSV cm/sEDV cm/sStenosisPlaque DescriptionComments           +----------+--------+--------+--------+------------------+------------------+ CCA Prox  70      13                                intimal thickening +----------+--------+--------+--------+------------------+------------------+ CCA Distal50      15                                intimal thickening +----------+--------+--------+--------+------------------+------------------+ ICA Prox  48      18                                intimal thickening +----------+--------+--------+--------+------------------+------------------+ ICA Mid   79      37                                intimal thickening +----------+--------+--------+--------+------------------+------------------+ ICA Distal59      24                                tortuous           +----------+--------+--------+--------+------------------+------------------+ ECA  65      9                                                    +----------+--------+--------+--------+------------------+------------------+ +----------+--------+-------+--------+-------------------+           PSV cm/sEDV cmsDescribeArm Pressure (mmHG) +----------+--------+-------+--------+-------------------+ Dlarojcpjw01      0              131                 +----------+--------+-------+--------+-------------------+ +---------+--------+--+--------+--+---------+ VertebralPSV cm/s63EDV cm/s17Antegrade +---------+--------+--+--------+--+---------+  Left Carotid Findings: +----------+--------+--------+--------+------------------+------------------+           PSV cm/sEDV cm/sStenosisPlaque DescriptionComments           +----------+--------+--------+--------+------------------+------------------+ CCA Prox  91      17                                intimal thickening  +----------+--------+--------+--------+------------------+------------------+ CCA Distal58      21                                intimal thickening +----------+--------+--------+--------+------------------+------------------+ ICA Prox  51      22                                intimal thickening +----------+--------+--------+--------+------------------+------------------+ ICA Mid   60      22                                intimal thickening +----------+--------+--------+--------+------------------+------------------+ ICA Distal56      25                                                   +----------+--------+--------+--------+------------------+------------------+ ECA       50      7                                 intimal thickening +----------+--------+--------+--------+------------------+------------------+ +----------+--------+--------+--------+-------------------+           PSV cm/sEDV cm/sDescribeArm Pressure (mmHG) +----------+--------+--------+--------+-------------------+ Subclavian101     0               122                 +----------+--------+--------+--------+-------------------+ +---------+--------+--+--------+--+---------+ VertebralPSV cm/s57EDV cm/s15Antegrade +---------+--------+--+--------+--+---------+   Summary: Right Carotid: The extracranial vessels were near-normal with only minimal wall                thickening or plaque. Left Carotid: The extracranial vessels were near-normal with only minimal wall               thickening or plaque.  *See table(s) above for measurements and observations.  Electronically signed by Gaile New MD on 09/11/2023 at 4:34:26 PM.    Final      EKG: None  Radiology: No results found.   Procedures   Medications Ordered in the ED  sodium chloride  0.9 % bolus 1,000 mL (has no administration in time range)  lactated ringers  bolus 1,000 mL (1,000 mLs Intravenous New Bag/Given 09/16/23 1412)   ondansetron  (ZOFRAN ) injection 4 mg (4 mg Intravenous Given 09/16/23 1410)                                    Medical Decision Making Problems Addressed: Dehydration: acute illness or injury with systemic symptoms that poses a threat to life or bodily functions Hyponatremia: acute illness or injury with systemic symptoms that poses a threat to life or bodily functions Nausea vomiting and diarrhea: acute illness or injury with systemic symptoms that poses a threat to life or bodily functions  Amount and/or Complexity of Data Reviewed Independent Historian:     Details: Family, hx External Data Reviewed: notes. Labs: ordered. Decision-making details documented in ED Course. Discussion of management or test interpretation with external provider(s): medicine  Risk Prescription drug management. Decision regarding hospitalization.   Iv ns. Continuous pulse ox and cardiac monitoring. Labs ordered/sent.   Differential diagnosis includes  gastroenteritis, dehydration, etc. Dispo decision including potential need for admission considered - will get labs and reassess.   Reviewed nursing notes and prior charts for additional history. External reports reviewed. Additional history from: family.   LR bolus. Zofran  iv.   Cardiac monitor: sinus rhythm, rate 80.  Labs reviewed/interpreted by me - wbc and hgb normal. Ua neg for uti, +ketones, ivf bolus.   Additional labs reviewed/interpreted by me - na v low, ns bolus.   Medicine consulted for admission.  CRITICAL CARE RE: severe hyponatremia, dehydration nvd Performed by: Irie Fiorello E Taleya Whitcher Total critical care time: 40 minutes Critical care time was exclusive of separately billable procedures and treating other patients. Critical care was necessary to treat or prevent imminent or life-threatening deterioration. Critical care was time spent personally by me on the following activities: development of treatment plan with patient and/or  surrogate as well as nursing, discussions with consultants, evaluation of patient's response to treatment, examination of patient, obtaining history from patient or surrogate, ordering and performing treatments and interventions, ordering and review of laboratory studies, ordering and review of radiographic studies, pulse oximetry and re-evaluation of patient's condition.       Final diagnoses:  None    ED Discharge Orders     None          Bernard Drivers, MD 09/16/23 1434

## 2023-09-16 NOTE — Plan of Care (Signed)
  Problem: Clinical Measurements: Goal: Respiratory complications will improve Outcome: Progressing   Problem: Activity: Goal: Risk for activity intolerance will decrease Outcome: Progressing   Problem: Nutrition: Goal: Adequate nutrition will be maintained Outcome: Progressing   Problem: Coping: Goal: Level of anxiety will decrease Outcome: Progressing   Problem: Elimination: Goal: Will not experience complications related to urinary retention Outcome: Progressing   Problem: Safety: Goal: Ability to remain free from injury will improve Outcome: Progressing   Problem: Skin Integrity: Goal: Risk for impaired skin integrity will decrease Outcome: Progressing

## 2023-09-16 NOTE — Progress Notes (Signed)
 Patient is due for GI panel. No BM since the patient got here on this floor. Will pass it to the next shift.

## 2023-09-16 NOTE — Progress Notes (Signed)
 Patient transferred from ED via bed at 1700 pm. . Alert and oriented. On RA. Family is at bed side. Connected to cardiac monitor box 15. Will continue to monitor through this shift.

## 2023-09-16 NOTE — ED Triage Notes (Signed)
 Pt arrived POV from home c/o nausea and vomiting and diarrhea since Thursday. Pt seen her PCP and given zofran  but family member states she is taking the zofran  every 2 hours. They also stated 7 weeks ago she had an exposure to black mold and since has had these issues and constant acid reflux.

## 2023-09-16 NOTE — ED Provider Triage Note (Signed)
 Emergency Medicine Provider Triage Evaluation Note  Susan Case , a 68 y.o. female  was evaluated in triage.  Pt complains of nausea, diarrhea.  Patient reports ongoing issues with vomiting, diarrhea since Thursday last week.  She is concerned that her symptoms developed after exposure to black mold.  Has been taking Zofran  every 2 hours with improvement in symptoms.  Endorses issues with constant reflux and taking a PPI without improvement in symptoms.  Denies any hematemesis, hematochezia, or melanotic stools.  Not tolerating p.o. at this time..  Review of Systems  Positive: As above Negative: As above  Physical Exam  BP (!) 166/88 (BP Location: Right Arm)   Pulse 73   Temp 98.4 F (36.9 C)   Resp 17   Ht 4' 11 (1.499 m)   Wt 40.4 kg   SpO2 100%   BMI 17.98 kg/m  Gen:   Awake, no distress  Resp:  Normal effort  MSK:   Moves extremities without difficulty  Other:    Medical Decision Making  Medically screening exam initiated at 11:55 AM.  Appropriate orders placed.  Susan Case was informed that the remainder of the evaluation will be completed by another provider, this initial triage assessment does not replace that evaluation, and the importance of remaining in the ED until their evaluation is complete.     Hank Walling A, PA-C 09/16/23 1156

## 2023-09-17 DIAGNOSIS — R112 Nausea with vomiting, unspecified: Secondary | ICD-10-CM | POA: Diagnosis not present

## 2023-09-17 DIAGNOSIS — Z9189 Other specified personal risk factors, not elsewhere classified: Secondary | ICD-10-CM

## 2023-09-17 DIAGNOSIS — E871 Hypo-osmolality and hyponatremia: Secondary | ICD-10-CM | POA: Diagnosis not present

## 2023-09-17 LAB — BASIC METABOLIC PANEL WITH GFR
Anion gap: 9 (ref 5–15)
Anion gap: 9 (ref 5–15)
Anion gap: 11 (ref 5–15)
BUN: 8 mg/dL (ref 8–23)
BUN: 10 mg/dL (ref 8–23)
BUN: 10 mg/dL (ref 8–23)
CO2: 21 mmol/L — ABNORMAL LOW (ref 22–32)
CO2: 21 mmol/L — ABNORMAL LOW (ref 22–32)
CO2: 20 mmol/L — ABNORMAL LOW (ref 22–32)
Calcium: 8.7 mg/dL — ABNORMAL LOW (ref 8.9–10.3)
Calcium: 8.5 mg/dL — ABNORMAL LOW (ref 8.9–10.3)
Calcium: 8.4 mg/dL — ABNORMAL LOW (ref 8.9–10.3)
Chloride: 95 mmol/L — ABNORMAL LOW (ref 98–111)
Chloride: 95 mmol/L — ABNORMAL LOW (ref 98–111)
Chloride: 93 mmol/L — ABNORMAL LOW (ref 98–111)
Creatinine, Ser: 0.41 mg/dL — ABNORMAL LOW (ref 0.44–1.00)
Creatinine, Ser: 0.6 mg/dL (ref 0.44–1.00)
Creatinine, Ser: 0.5 mg/dL (ref 0.44–1.00)
GFR, Estimated: >60 mL/min (ref >60)
GFR, Estimated: >60 mL/min (ref >60)
GFR, Estimated: >60 mL/min (ref >60)
Glucose, Bld: 105 mg/dL — ABNORMAL HIGH (ref 70–99)
Glucose, Bld: 129 mg/dL — ABNORMAL HIGH (ref 70–99)
Glucose, Bld: 104 mg/dL — ABNORMAL HIGH (ref 70–99)
Potassium: 3.4 mmol/L — ABNORMAL LOW (ref 3.5–5.1)
Potassium: 3.2 mmol/L — ABNORMAL LOW (ref 3.5–5.1)
Potassium: 3.4 mmol/L — ABNORMAL LOW (ref 3.5–5.1)
Sodium: 125 mmol/L — ABNORMAL LOW (ref 135–145)
Sodium: 125 mmol/L — ABNORMAL LOW (ref 135–145)
Sodium: 124 mmol/L — ABNORMAL LOW (ref 135–145)

## 2023-09-17 LAB — CBC
HCT: 35.9 % — ABNORMAL LOW (ref 36.0–46.0)
Hemoglobin: 13.3 g/dL (ref 12.0–15.0)
MCH: 32.7 pg (ref 26.0–34.0)
MCHC: 37 g/dL — ABNORMAL HIGH (ref 30.0–36.0)
MCV: 88.2 fL (ref 80.0–100.0)
Platelets: 254 K/uL (ref 150–400)
RBC: 4.07 MIL/uL (ref 3.87–5.11)
RDW: 12.2 % (ref 11.5–15.5)
WBC: 6.7 K/uL (ref 4.0–10.5)
nRBC: 0.3 % — ABNORMAL HIGH (ref 0.0–0.2)

## 2023-09-17 LAB — MAGNESIUM: Magnesium: 1.9 mg/dL (ref 1.7–2.4)

## 2023-09-17 MED ORDER — CALCIUM CARBONATE 1250 (500 CA) MG PO TABS
500.0000 mg | ORAL_TABLET | Freq: Two times a day (BID) | ORAL | Status: DC
  Administered 2023-09-18 – 2023-09-19 (×3): 1250 mg via ORAL
  Filled 2023-09-17 (×3): qty 1

## 2023-09-17 MED ORDER — TEMAZEPAM 7.5 MG PO CAPS
30.0000 mg | ORAL_CAPSULE | Freq: Every day | ORAL | Status: DC
  Administered 2023-09-17 – 2023-09-18 (×2): 30 mg via ORAL
  Filled 2023-09-17 (×2): qty 4

## 2023-09-17 MED ORDER — SODIUM CHLORIDE 0.9 % IV SOLN: INTRAVENOUS | Status: AC

## 2023-09-17 MED ORDER — SENNA 8.6 MG PO TABS
1.0000 | ORAL_TABLET | Freq: Every day | ORAL | Status: DC | PRN
  Administered 2023-09-17 – 2023-09-19 (×3): 8.6 mg via ORAL
  Filled 2023-09-17 (×3): qty 1

## 2023-09-17 NOTE — Plan of Care (Signed)

## 2023-09-17 NOTE — Progress Notes (Signed)
 Progress Note   Patient: Susan Case [id number] DOB: 1955/03/11 DOA: 09/16/2023     1 DOS: the patient was seen and examined on 09/17/2023   Brief hospital course: Susan Case is a 68 y.o. female with medical history significant of hyperlipidemia, arthritis, osteoporosis who presents with nausea, vomiting. Admitted for severe hyponatremia, viral gastroenteritis  Assessment and Plan: Acute Hyponatremia Hypovolemic hyponatremia secondary to nausea vomiting diarrhea. Goal correction no more than 10 mmol/L in a 24-hour period Will give normal saline IV fluids at 40 mL/h.  Na improved to 124. BMP changed to q12. Encourage oral diet, supplements.   Nausea, vomiting, diarrhea Possibility of a viral gastroenteritis as a cause of symptoms. Nausea better, able to tolerate clears. Monitor intake and output. Liquids, advance diet as tolerated. Check GI panel if diarrhea persist.   Hypocalcemia S/p calcium  gluconate 1 g IV Continue home oral supplements.   Hyperlipidemia Continue Crestor   At risk for malnutrition- BMI 17.98 Daughter states she is picky eater, asks for nutrition eval. Dietician consulted.      Out of bed to chair. Incentive spirometry. Nursing supportive care. Fall, aspiration precautions. Diet:  Diet Orders (From admission, onward)     Start     Ordered   09/16/23 1514  DIET SOFT Room service appropriate? Yes; Fluid consistency: Thin  Diet effective now       Question Answer Comment  Room service appropriate? Yes   Fluid consistency: Thin      09/16/23 1513           DVT prophylaxis: enoxaparin  (LOVENOX ) injection 30 mg Start: 09/17/23 1000  Level of care: Telemetry Medical   Code Status: Full Code  Subjective: Patient is seen and examined today morning. Nausea improved. Denies abdominal pain. Tolerating liquids.   Physical Exam: Vitals:   09/17/23 0455 09/17/23 0800 09/17/23 1207 09/17/23 1730  BP: (!) 99/58 115/71 111/68 132/78  Pulse: 68 86  74 80  Resp: 16     Temp: 99.5 F (37.5 C) 98.2 F (36.8 C) 98.1 F (36.7 C) 98.4 F (36.9 C)  TempSrc: Oral     SpO2: 99% 98% 99% 98%  Weight:      Height:        General - Elderly thin built Caucasian female, no apparent distress HEENT - PERRLA, EOMI, atraumatic head, non tender sinuses. Lung - Clear, no rales, rhonchi, wheezes. Heart - S1, S2 heard, no murmurs, rubs, no pedal edema. Abdomen - Soft, non tender, bowel sounds good Neuro - Alert, awake and oriented x 3, non focal exam. Skin - Warm and dry.  Data Reviewed:      Latest Ref Rng & Units 09/17/2023    1:26 AM 09/16/2023   12:17 PM 05/06/2020   10:20 AM  CBC  WBC 4.0 - 10.5 K/uL 6.7  8.9  4.9   Hemoglobin 12.0 - 15.0 g/dL 86.6  86.3  86.3   Hematocrit 36.0 - 46.0 % 35.9  36.8  39.4   Platelets 150 - 400 K/uL 254  287  252       Latest Ref Rng & Units 09/17/2023    4:46 PM 09/17/2023   12:50 PM 09/17/2023    1:26 AM  BMP  Glucose 70 - 99 mg/dL 895  870  894   BUN 8 - 23 mg/dL 10  10  8    Creatinine 0.44 - 1.00 mg/dL 9.49  9.39  9.58   Sodium 135 - 145 mmol/L 124  125  125  Potassium 3.5 - 5.1 mmol/L 3.4  3.2  3.4   Chloride 98 - 111 mmol/L 93  95  95   CO2 22 - 32 mmol/L 20  21  21    Calcium  8.9 - 10.3 mg/dL 8.4  8.5  8.7    No results found.  Family Communication: Discussed with patient, daughter over phone. They understand and agree. All questions answered.  Disposition: Status is: Inpatient Remains inpatient appropriate because: advance diet.  Planned Discharge Destination: Home     Time spent: 40 minutes  Author: Concepcion Riser, MD 09/17/2023 7:21 PM Secure chat 7am to 7pm For on call review [email or web address].

## 2023-09-17 NOTE — Progress Notes (Signed)
 Patient is due for GI panel. No BM since patient care was assumed at night shift. Will pass it to the next shift.

## 2023-09-18 DIAGNOSIS — E871 Hypo-osmolality and hyponatremia: Secondary | ICD-10-CM | POA: Diagnosis not present

## 2023-09-18 DIAGNOSIS — R112 Nausea with vomiting, unspecified: Secondary | ICD-10-CM | POA: Diagnosis not present

## 2023-09-18 DIAGNOSIS — Z9189 Other specified personal risk factors, not elsewhere classified: Secondary | ICD-10-CM | POA: Diagnosis not present

## 2023-09-18 LAB — BASIC METABOLIC PANEL WITH GFR
Anion gap: 8 (ref 5–15)
Anion gap: 12 (ref 5–15)
BUN: 8 mg/dL (ref 8–23)
BUN: 9 mg/dL (ref 8–23)
CO2: 25 mmol/L (ref 22–32)
CO2: 25 mmol/L (ref 22–32)
Calcium: 8.6 mg/dL — ABNORMAL LOW (ref 8.9–10.3)
Calcium: 9 mg/dL (ref 8.9–10.3)
Chloride: 98 mmol/L (ref 98–111)
Chloride: 94 mmol/L — ABNORMAL LOW (ref 98–111)
Creatinine, Ser: 0.51 mg/dL (ref 0.44–1.00)
Creatinine, Ser: 0.57 mg/dL (ref 0.44–1.00)
GFR, Estimated: >60 mL/min (ref >60)
GFR, Estimated: >60 mL/min (ref >60)
Glucose, Bld: 98 mg/dL (ref 70–99)
Glucose, Bld: 80 mg/dL (ref 70–99)
Potassium: 3.5 mmol/L (ref 3.5–5.1)
Potassium: 3.4 mmol/L — ABNORMAL LOW (ref 3.5–5.1)
Sodium: 131 mmol/L — ABNORMAL LOW (ref 135–145)
Sodium: 131 mmol/L — ABNORMAL LOW (ref 135–145)

## 2023-09-18 MED ORDER — BISACODYL 10 MG RE SUPP
10.0000 mg | Freq: Every day | RECTAL | Status: DC | PRN
  Administered 2023-09-18 – 2023-09-19 (×2): 10 mg via RECTAL
  Filled 2023-09-18 (×2): qty 1

## 2023-09-18 MED ORDER — IBUPROFEN 200 MG PO TABS
400.0000 mg | ORAL_TABLET | Freq: Once | ORAL | Status: AC
  Administered 2023-09-18: 400 mg via ORAL
  Filled 2023-09-18: qty 2

## 2023-09-18 NOTE — Plan of Care (Signed)

## 2023-09-18 NOTE — Progress Notes (Signed)
 Progress Note   Patient: Susan Case FMW:[id number] DOB: [birth year] DOA: 09/16/2023     2 DOS: the patient was seen and examined on 09/18/2023   Brief hospital course: Susan Case is a 68 y.o. female with medical history significant of hyperlipidemia, arthritis, osteoporosis who presents with nausea, vomiting. Admitted for severe hyponatremia, viral gastroenteritis  Assessment and Plan: Acute Hyponatremia Hypovolemic hyponatremia secondary to nausea vomiting diarrhea. Goal correction no more than 10 mmol/L in a 24-hour period Continue normal saline IV fluids at 40 mL/h.  Na improved to 131. BMP changed to q12. Encourage oral diet, supplements.   Nausea, vomiting, diarrhea Possibility of a viral gastroenteritis as a cause of symptoms. Nausea better, able to tolerate liquids. Monitor intake and output. Advance diet to soft. Check GI panel if diarrhea persist.   Hypocalcemia S/p calcium  gluconate 1 g IV Continue home oral supplements.   Hyperlipidemia Continue Crestor   At risk for malnutrition- BMI 17.98 Dietician consulted per family request.      Out of bed to chair. Incentive spirometry. Nursing supportive care. Fall, aspiration precautions. Diet:  Diet Orders (From admission, onward)     Start     Ordered   09/16/23 1514  DIET SOFT Room service appropriate? Yes; Fluid consistency: Thin  Diet effective now       Question Answer Comment  Room service appropriate? Yes   Fluid consistency: Thin      09/16/23 1513           DVT prophylaxis: enoxaparin  (LOVENOX ) injection 30 mg Start: 09/17/23 1000  Level of care: Telemetry Medical   Code Status: Full Code  Subjective: Patient is seen and examined today morning. Nausea and abdominal pain better. Tolerating liquids well, wishes to try soft. Na improved.  Physical Exam: Vitals:   09/18/23 0010 09/18/23 0430 09/18/23 0900 09/18/23 1222  BP: 118/81 90/64 126/72 132/86  Pulse: 85 86 85 86  Resp: 16 16 20 19    Temp: 97.8 F (36.6 C) 98.2 F (36.8 C) 98.5 F (36.9 C) 97.7 F (36.5 C)  TempSrc: Oral Oral Oral Oral  SpO2: 97% 98% 100% 97%  Weight:      Height:        General - Elderly thin built Caucasian female, no apparent distress HEENT - PERRLA, EOMI, atraumatic head, non tender sinuses. Lung - Clear, no rales, rhonchi, wheezes. Heart - S1, S2 heard, no murmurs, rubs, no pedal edema. Abdomen - Soft, non tender, bowel sounds good Neuro - Alert, awake and oriented x 3, non focal exam. Skin - Warm and dry.  Data Reviewed:      Latest Ref Rng & Units 09/17/2023    1:26 AM 09/16/2023   12:17 PM 05/06/2020   10:20 AM  CBC  WBC 4.0 - 10.5 K/uL 6.7  8.9  4.9   Hemoglobin 12.0 - 15.0 g/dL 86.6  86.3  86.3   Hematocrit 36.0 - 46.0 % 35.9  36.8  39.4   Platelets 150 - 400 K/uL 254  287  252       Latest Ref Rng & Units 09/18/2023    6:37 AM 09/17/2023    4:46 PM 09/17/2023   12:50 PM  BMP  Glucose 70 - 99 mg/dL 98  895  870   BUN 8 - 23 mg/dL 8  10  10    Creatinine 0.44 - 1.00 mg/dL 9.48  9.49  9.39   Sodium 135 - 145 mmol/L 131  124  125   Potassium 3.5 -  5.1 mmol/L 3.5  3.4  3.2   Chloride 98 - 111 mmol/L 98  93  95   CO2 22 - 32 mmol/L 25  20  21    Calcium  8.9 - 10.3 mg/dL 8.6  8.4  8.5    No results found.  Family Communication: Discussed with patient, daughter over phone. They understand and agree. All questions answered.  Disposition: Status is: Inpatient Remains inpatient appropriate because: advance diet, monitor Na.  Planned Discharge Destination: Home     Time spent: 43 minutes  Author: Concepcion Riser, MD 09/18/2023 3:38 PM Secure chat 7am to 7pm For on call review [email or web address]

## 2023-09-18 NOTE — Progress Notes (Signed)
 Patient have a pending GI panel ( stool ) to be collected since admission. Patient states that she hasn't had any episode of diarrhea since she was hospitalized, this is why it still pending. Patient complaints of constipation and stool softeners were administered. Provider was contacted to clarify whether or not the stool sample needs to be collected since the patient is constipated. Provider stated I would only send stool sample to the lab if it's loose. Stool sample was not collected since she is not presenting diarrhea. This information will be passed to day shift RN to follow. Will continue to monitor.

## 2023-09-19 ENCOUNTER — Other Ambulatory Visit (HOSPITAL_COMMUNITY): Payer: Self-pay

## 2023-09-19 DIAGNOSIS — E871 Hypo-osmolality and hyponatremia: Secondary | ICD-10-CM | POA: Diagnosis not present

## 2023-09-19 DIAGNOSIS — E860 Dehydration: Secondary | ICD-10-CM | POA: Diagnosis not present

## 2023-09-19 DIAGNOSIS — E43 Unspecified severe protein-calorie malnutrition: Secondary | ICD-10-CM | POA: Insufficient documentation

## 2023-09-19 DIAGNOSIS — R112 Nausea with vomiting, unspecified: Secondary | ICD-10-CM | POA: Diagnosis not present

## 2023-09-19 LAB — BASIC METABOLIC PANEL WITH GFR
Anion gap: 9 (ref 5–15)
BUN: 7 mg/dL — ABNORMAL LOW (ref 8–23)
CO2: 26 mmol/L (ref 22–32)
Calcium: 9 mg/dL (ref 8.9–10.3)
Chloride: 100 mmol/L (ref 98–111)
Creatinine, Ser: 0.53 mg/dL (ref 0.44–1.00)
GFR, Estimated: >60 mL/min (ref >60)
Glucose, Bld: 94 mg/dL (ref 70–99)
Potassium: 3.8 mmol/L (ref 3.5–5.1)
Sodium: 135 mmol/L (ref 135–145)

## 2023-09-19 MED ORDER — ADULT MULTIVITAMIN W/MINERALS CH
1.0000 | ORAL_TABLET | Freq: Every day | ORAL | 1 refills | Status: AC
  Filled 2023-09-19: qty 90, 90d supply, fill #0
  Filled 2024-01-01: qty 90, 90d supply, fill #1
  Filled 2024-01-01: qty 90, 90d supply, fill #0

## 2023-09-19 MED ORDER — ENSURE PLUS HIGH PROTEIN PO LIQD: 237.0000 mL | Freq: Three times a day (TID) | ORAL | Status: DC

## 2023-09-19 MED ORDER — ENSURE PLUS HIGH PROTEIN PO LIQD
237.0000 mL | Freq: Three times a day (TID) | ORAL | 1 refills | Status: AC
Start: 2023-09-19 — End: ?
  Filled 2023-09-19: qty 21330, 30d supply, fill #0

## 2023-09-19 MED ORDER — ADULT MULTIVITAMIN W/MINERALS CH: 1.0000 | ORAL_TABLET | Freq: Every day | ORAL | Status: DC

## 2023-09-19 NOTE — Care Management Important Message (Signed)
 Important Message  Patient Details  Name: Susan Case MRN: [id number] Date of Birth: 1955/02/21   Important Message Given:  Yes - Medicare IM  Patient left prior to IM delivery will mail a copy to the patient home address.    Blyss Lugar 09/19/2023, 1:42 PM

## 2023-09-19 NOTE — Progress Notes (Signed)
 Initial Nutrition Assessment  DOCUMENTATION CODES:   Severe malnutrition in context of chronic illness  INTERVENTION:  -Continue Soft diet, thin liquids -Add Ensure Plus High Protein TID -Add MVI -Continue to encourage PO intake -Discussed scheduling eating, high kcal/high pro fortification, desirable weight gain   NUTRITION DIAGNOSIS:   Severe Malnutrition related to poor appetite, chronic illness, nausea, diarrhea, vomiting as evidenced by severe muscle depletion, severe fat depletion.  GOAL:   Patient will meet greater than or equal to 90% of their needs  MONITOR:   PO intake, Weight trends, Labs, Skin, Supplement acceptance  REASON FOR ASSESSMENT:   Consult Assessment of nutrition requirement/status  ASSESSMENT:   Hx hyperlipidemia, arthritis, osteoporosis who presents with nausea, vomiting. Admitted for severe hyponatremia, viral gastroenteritis.  Spoke to pt and daughter at length regarding high kcal, high protein diet education, food fortification, scheduling meals. Pt with n/v/c prior to admission. No d, or chewing/swallowing difficulties. Pt discusses long history of inadequate PO intake, dtr states the pt has always been mindful/afraid? Of gaining weight. Discussed need for adequate kcal/pro to fuel body, healing, mobility, etc. Pt states she only eats when she is hungry, and she is not hungry very often. Encouraged scheduled times to eat, small frequent meals. Pt/dtr verbalize understanding. Discussed ONS options here and while at home. Pt with documented PO intake of 50% x 2 meals this admission. High kcal/high pro handout added to d/c instructions, plan for pt to d/c today.  NFPE completed (see below), pt meets criteria for severe protein calorie malnutrition. +EPHP TID, +MVI. Discussed potential of outpatient dietitian visits in the future to help monitoring of nutritional status. Pt/dtr deny additional questions/concerns at this time, will continue to monitor, RDN  available prn.  Labs BUN 7 BG 80-104 HCT 35.9  Medications  calcium  carbonate  500 mg of elemental calcium  Oral BID WC   enoxaparin  (LOVENOX ) injection  30 mg Subcutaneous Q24H   pantoprazole  (PROTONIX ) IV  40 mg Intravenous Q12H   pregabalin   150 mg Oral BID   rosuvastatin   5 mg Oral q AM   sodium chloride  flush  3 mL Intravenous Q12H   temazepam   30 mg Oral QHS     NUTRITION - FOCUSED PHYSICAL EXAM:  Flowsheet Row Most Recent Value  Orbital Region Severe depletion  Upper Arm Region Severe depletion  Thoracic and Lumbar Region Severe depletion  Buccal Region Severe depletion  Temple Region Severe depletion  Clavicle Bone Region Severe depletion  Clavicle and Acromion Bone Region Severe depletion  Scapular Bone Region Severe depletion  Dorsal Hand Severe depletion  Patellar Region Severe depletion  Anterior Thigh Region Severe depletion  Posterior Calf Region Severe depletion  Edema (RD Assessment) None  Hair Reviewed  Eyes Reviewed  Mouth Reviewed  Skin Reviewed  Nails Reviewed    Diet Order:   Diet Order             DIET SOFT Room service appropriate? Yes; Fluid consistency: Thin  Diet effective now                  EDUCATION NEEDS:   Education needs have been addressed  Skin:  Skin Assessment: Reviewed RN Assessment  Last BM:  8/19 type 5  Height:   Ht Readings from Last 1 Encounters:  09/16/23 4' 11 (1.499 m)    Weight:   Wt Readings from Last 1 Encounters:  09/16/23 40.4 kg    BMI:  Body mass index is 17.98 kg/m.  Estimated Nutritional  Needs:   Kcal:  1200-1400 kcal  Protein:  60-80g  Fluid:  >/=1.5L  Shamyia Grandpre Daml-Budig, RDN, LDN Registered Dietitian Nutritionist RD Inpatient Contact Info in [place]

## 2023-09-19 NOTE — Discharge Summary (Signed)
 Physician Discharge Summary   Patient: Susan Case MRN: [id number] DOB: Jun 05, 1955  Admit date:     09/16/2023  Discharge date: {dischdate:26783}  Discharge Physician: Concepcion Riser   PCP: Avva, Ravisankar, MD   Recommendations at discharge:  {Tip this will not be part of the note when signed- Example include specific recommendations for outpatient follow-up, pending tests to follow-up on. (Optional):26781}  ***  Discharge Diagnoses: Principal Problem:   Hyponatremia Active Problems:   Nausea, vomiting, and diarrhea   Hypocalcemia   Hyperlipidemia   Protein-calorie malnutrition, severe  Resolved Problems:   * No resolved hospital problems. [facility] Course: No notes on file  Assessment and Plan: No notes have been filed under this hospital service. Service: Hospitalist     {Tip this will not be part of the note when signed Body mass index is 17.98 kg/m. ,  Nutrition Documentation    Flowsheet Row ED to Hosp-Admission (Current) from 09/16/2023 in [facility]  Nutrition Problem Severe Malnutrition  Etiology poor appetite, chronic illness, nausea, diarrhea, vomiting  Nutrition Goal Patient will meet greater than or equal to 90% of their needs  Interventions Refer to RD note for recommendations  ,  (Optional):26781}  {(NOTE) Pain control PDMP Statment (Optional):26782} Consultants: *** Procedures performed: ***  Disposition: {Plan; Disposition:26390} Diet recommendation:  Discharge Diet Orders (From admission, onward)     Start     Ordered   09/19/23 0000  Diet general        09/19/23 1227           {Diet_Plan:26776} DISCHARGE MEDICATION: Allergies as of 09/19/2023       Reactions   Codeine Itching, Nausea Only        Medication List     STOP taking these medications    valACYclovir 1000 MG tablet Commonly known as: VALTREX       TAKE these medications    alendronate 70 MG tablet Commonly known as:  FOSAMAX Take 70 mg by mouth once a week.   calcium  carbonate 1500 (600 Ca) MG Tabs tablet Commonly known as: OSCAL Take 600 mg of elemental calcium  by mouth in the morning and at bedtime.   feeding supplement Liqd Take 237 mLs by mouth 3 (three) times daily between meals.   multivitamin with minerals Tabs tablet Take 1 tablet by mouth daily.   Natural Vitamin D-3 125 MCG (5000 UT) Tabs Generic drug: Cholecalciferol Take 5,000 Units by mouth in the morning.   ondansetron  4 MG disintegrating tablet Commonly known as: ZOFRAN -ODT Take 4 mg by mouth 2 (two) times daily as needed for nausea or vomiting.   pantoprazole  40 MG tablet Commonly known as: PROTONIX  Take 40 mg by mouth 2 (two) times daily.   pregabalin  150 MG capsule Commonly known as: LYRICA  Take 150 mg by mouth daily.   rosuvastatin  5 MG tablet Commonly known as: CRESTOR  Take 5 mg by mouth in the morning.   temazepam  30 MG capsule Commonly known as: RESTORIL  Take 30 mg by mouth at bedtime.        Discharge Exam: Filed Weights   09/16/23 1142  Weight: 40.4 kg   ***  Condition at discharge: {DC Condition:26389}  The results of significant diagnostics from this hospitalization (including imaging, microbiology, ancillary and laboratory) are listed below for reference.   Imaging Studies: VAS US  CAROTID Result Date: 09/11/2023 Carotid Arterial Duplex Study Patient Name:  Susan Case  Date of Exam:   09/11/2023 Medical Rec #:  [id number]   Accession #:    [id number] Date of Birth: [birth year]  Patient Gender: F Patient Age:   68 years Exam Location:  [address] Procedure:      VAS US  CAROTID Referring Phys: SEARCY AVVA --------------------------------------------------------------------------------  Indications:       Visual disturbance. Risk Factors:      No history of smoking. Comparison Study:  N/A Performing Technologist: Dena Pane  Examination Guidelines: A complete evaluation includes B-mode imaging,  spectral Doppler, color Doppler, and power Doppler as needed of all accessible portions of each vessel. Bilateral testing is considered an integral part of a complete examination. Limited examinations for reoccurring indications may be performed as noted.  Right Carotid Findings: +----------+--------+--------+--------+------------------+------------------+           PSV cm/sEDV cm/sStenosisPlaque DescriptionComments           +----------+--------+--------+--------+------------------+------------------+ CCA Prox  70      13                                intimal thickening +----------+--------+--------+--------+------------------+------------------+ CCA Distal50      15                                intimal thickening +----------+--------+--------+--------+------------------+------------------+ ICA Prox  48      18                                intimal thickening +----------+--------+--------+--------+------------------+------------------+ ICA Mid   79      37                                intimal thickening +----------+--------+--------+--------+------------------+------------------+ ICA Distal59      24                                tortuous           +----------+--------+--------+--------+------------------+------------------+ ECA       65      9                                                    +----------+--------+--------+--------+------------------+------------------+ +----------+--------+-------+--------+-------------------+           PSV cm/sEDV cmsDescribeArm Pressure (mmHG) +----------+--------+-------+--------+-------------------+ Dlarojcpjw01      0              131                 +----------+--------+-------+--------+-------------------+ +---------+--------+--+--------+--+---------+ VertebralPSV cm/s63EDV cm/s17Antegrade +---------+--------+--+--------+--+---------+  Left Carotid Findings:  +----------+--------+--------+--------+------------------+------------------+           PSV cm/sEDV cm/sStenosisPlaque DescriptionComments           +----------+--------+--------+--------+------------------+------------------+ CCA Prox  91      17                                intimal thickening +----------+--------+--------+--------+------------------+------------------+ CCA Distal58      21  intimal thickening +----------+--------+--------+--------+------------------+------------------+ ICA Prox  51      22                                intimal thickening +----------+--------+--------+--------+------------------+------------------+ ICA Mid   60      22                                intimal thickening +----------+--------+--------+--------+------------------+------------------+ ICA Distal56      25                                                   +----------+--------+--------+--------+------------------+------------------+ ECA       50      7                                 intimal thickening +----------+--------+--------+--------+------------------+------------------+ +----------+--------+--------+--------+-------------------+           PSV cm/sEDV cm/sDescribeArm Pressure (mmHG) +----------+--------+--------+--------+-------------------+ Subclavian101     0               122                 +----------+--------+--------+--------+-------------------+ +---------+--------+--+--------+--+---------+ VertebralPSV cm/s57EDV cm/s15Antegrade +---------+--------+--+--------+--+---------+   Summary: Right Carotid: The extracranial vessels were near-normal with only minimal wall                thickening or plaque. Left Carotid: The extracranial vessels were near-normal with only minimal wall               thickening or plaque.  *See table(s) above for measurements and observations.  Electronically signed by Gaile New MD  on 09/11/2023 at 4:34:26 PM.    Final     Microbiology: Results for orders placed or performed during the hospital encounter of 09/16/23  Resp panel by RT-PCR (RSV, Flu A&B, Covid) Anterior Nasal Swab     Status: None   Collection Time: 09/16/23  4:31 PM   Specimen: Anterior Nasal Swab  Result Value Ref Range Status   SARS Coronavirus 2 by RT PCR NEGATIVE NEGATIVE Final   Influenza A by PCR NEGATIVE NEGATIVE Final   Influenza B by PCR NEGATIVE NEGATIVE Final    Comment: (NOTE) The Xpert Xpress SARS-CoV-2/FLU/RSV plus assay is intended as an aid in the diagnosis of influenza from Nasopharyngeal swab specimens and should not be used as a sole basis for treatment. Nasal washings and aspirates are unacceptable for Xpert Xpress SARS-CoV-2/FLU/RSV testing.  Fact Sheet for Patients: BloggerCourse.com  Fact Sheet for Healthcare Providers: SeriousBroker.it  This test is not yet approved or cleared by the [place]  FDA and has been authorized for detection and/or diagnosis of SARS-CoV-2 by FDA under an Emergency Use Authorization (EUA). This EUA will remain in effect (meaning this test can be used) for the duration of the COVID-19 declaration under Section 564(b)(1) of the Act, 21 U.S.C. section 360bbb-3(b)(1), unless the authorization is terminated or revoked.     Resp Syncytial Virus by PCR NEGATIVE NEGATIVE Final    Comment: (NOTE) Fact Sheet for Patients: BloggerCourse.com  Fact Sheet for Healthcare Providers: SeriousBroker.it  This test is not yet approved or cleared by the [place]  FDA  and has been authorized for detection and/or diagnosis of SARS-CoV-2 by FDA under an Emergency Use Authorization (EUA). This EUA will remain in effect (meaning this test can be used) for the duration of the COVID-19 declaration under Section 564(b)(1) of the Act, 21 U.S.C. section  360bbb-3(b)(1), unless the authorization is terminated or revoked.  Performed at [facility], [address], [place], [place] [address]     Labs: CBC: Recent Labs  Lab 09/16/23 1217 09/17/23 0126  WBC 8.9 6.7  NEUTROABS 6.6  --   HGB 13.6 13.3  HCT 36.8 35.9*  MCV 87.2 88.2  PLT 287 254   Basic Metabolic Panel: Recent Labs  Lab 09/16/23 1432 09/16/23 1807 09/17/23 1250 09/17/23 1646 09/18/23 0637 09/18/23 1805 09/19/23 0437  NA  --    < > 125* 124* 131* 131* 135  K  --    < > 3.2* 3.4* 3.5 3.4* 3.8  CL  --    < > 95* 93* 98 94* 100  CO2  --    < > 21* 20* 25 25 26   GLUCOSE  --    < > 129* 104* 98 80 94  BUN  --    < > 10 10 8 9  7*  CREATININE  --    < > 0.60 0.50 0.51 0.57 0.53  CALCIUM   --    < > 8.5* 8.4* 8.6* 9.0 9.0  MG 1.9  --   --  1.9  --   --   --    < > = values in this interval not displayed.   Liver Function Tests: Recent Labs  Lab 09/16/23 1217  AST 18  ALT 14  ALKPHOS 45  BILITOT 1.4*  PROT 6.8  ALBUMIN 3.9   CBG: No results for input(s): GLUCAP in the last 168 hours.  Discharge time spent: {LESS THAN/GREATER UYJW:73611} 30 minutes.  Signed: Concepcion Riser, MD [facility] 09/19/2023

## 2023-09-19 NOTE — Plan of Care (Signed)
   Problem: Education: Goal: Knowledge of General Education information will improve Description Including pain rating scale, medication(s)/side effects and non-pharmacologic comfort measures Outcome: Progressing

## 2023-09-19 NOTE — Progress Notes (Signed)
 PT Cancellation Note  Patient Details Name: Susan Case MRN: [id number] DOB: 27-May-1955   Cancelled Treatment:    Reason Eval/Treat Not Completed: Other (comment) Noted imminent d/c order, upon my arrival, pt being wheeled out by volunteer to d/c. RN states pt ambulatory in hallway without DME earlier in day.   Izetta Call, PT, DPT   Acute Rehabilitation Department Office [phone] Secure Chat Communication Preferred   Izetta JULIANNA Call 09/19/2023, 1:27 PM

## 2023-09-19 NOTE — Plan of Care (Signed)

## 2023-09-26 DIAGNOSIS — E785 Hyperlipidemia, unspecified: Secondary | ICD-10-CM | POA: Diagnosis not present

## 2023-09-26 DIAGNOSIS — E43 Unspecified severe protein-calorie malnutrition: Secondary | ICD-10-CM | POA: Diagnosis not present

## 2023-09-26 DIAGNOSIS — R112 Nausea with vomiting, unspecified: Secondary | ICD-10-CM | POA: Diagnosis not present

## 2023-09-26 DIAGNOSIS — E871 Hypo-osmolality and hyponatremia: Secondary | ICD-10-CM | POA: Diagnosis not present

## 2023-09-26 DIAGNOSIS — K219 Gastro-esophageal reflux disease without esophagitis: Secondary | ICD-10-CM | POA: Diagnosis not present

## 2023-09-26 DIAGNOSIS — F1010 Alcohol abuse, uncomplicated: Secondary | ICD-10-CM | POA: Diagnosis not present

## 2023-09-26 DIAGNOSIS — R197 Diarrhea, unspecified: Secondary | ICD-10-CM | POA: Diagnosis not present

## 2023-09-26 DIAGNOSIS — R519 Headache, unspecified: Secondary | ICD-10-CM | POA: Diagnosis not present

## 2023-10-03 ENCOUNTER — Other Ambulatory Visit: Payer: Self-pay | Admitting: Family Medicine

## 2023-10-03 DIAGNOSIS — R519 Headache, unspecified: Secondary | ICD-10-CM

## 2023-10-03 DIAGNOSIS — E871 Hypo-osmolality and hyponatremia: Secondary | ICD-10-CM

## 2023-10-04 ENCOUNTER — Encounter: Payer: Self-pay | Admitting: Family Medicine

## 2023-10-10 ENCOUNTER — Encounter: Payer: Self-pay | Admitting: Family Medicine

## 2023-10-10 ENCOUNTER — Ambulatory Visit
Admission: RE | Admit: 2023-10-10 | Discharge: 2023-10-10 | Disposition: A | Discharge status: Home / Self Care | Source: Ambulatory Visit | Attending: Family Medicine | Admitting: Family Medicine

## 2023-10-10 DIAGNOSIS — J479 Bronchiectasis, uncomplicated: Secondary | ICD-10-CM | POA: Diagnosis not present

## 2023-10-10 DIAGNOSIS — E871 Hypo-osmolality and hyponatremia: Secondary | ICD-10-CM

## 2023-10-14 ENCOUNTER — Ambulatory Visit
Admission: RE | Admit: 2023-10-14 | Discharge: 2023-10-14 | Disposition: A | Discharge status: Home / Self Care | Source: Ambulatory Visit | Attending: Family Medicine | Admitting: Family Medicine

## 2023-10-14 DIAGNOSIS — R519 Headache, unspecified: Secondary | ICD-10-CM

## 2023-10-23 DIAGNOSIS — M542 Cervicalgia: Secondary | ICD-10-CM | POA: Diagnosis not present

## 2023-10-23 DIAGNOSIS — R519 Headache, unspecified: Secondary | ICD-10-CM | POA: Diagnosis not present

## 2023-10-23 DIAGNOSIS — R911 Solitary pulmonary nodule: Secondary | ICD-10-CM | POA: Diagnosis not present

## 2023-10-23 DIAGNOSIS — J018 Other acute sinusitis: Secondary | ICD-10-CM | POA: Diagnosis not present

## 2023-10-23 DIAGNOSIS — Q283 Other malformations of cerebral vessels: Secondary | ICD-10-CM | POA: Diagnosis not present

## 2023-10-23 DIAGNOSIS — E43 Unspecified severe protein-calorie malnutrition: Secondary | ICD-10-CM | POA: Diagnosis not present

## 2023-10-23 DIAGNOSIS — E785 Hyperlipidemia, unspecified: Secondary | ICD-10-CM | POA: Diagnosis not present

## 2023-10-23 DIAGNOSIS — E871 Hypo-osmolality and hyponatremia: Secondary | ICD-10-CM | POA: Diagnosis not present

## 2023-10-23 DIAGNOSIS — R112 Nausea with vomiting, unspecified: Secondary | ICD-10-CM | POA: Diagnosis not present

## 2023-10-23 DIAGNOSIS — K219 Gastro-esophageal reflux disease without esophagitis: Secondary | ICD-10-CM | POA: Diagnosis not present

## 2023-10-23 DIAGNOSIS — M5030 Other cervical disc degeneration, unspecified cervical region: Secondary | ICD-10-CM | POA: Diagnosis not present

## 2023-11-04 NOTE — Progress Notes (Addendum)
 "  New Patient Pulmonology Office Visit   Subjective:  Patient ID: Susan Case, female    DOB: Feb 09, 1955  MRN: [id number]  Referred by: Janey Santos, MD  CC:  Chief Complaint  Patient presents with   Consult    Lung nodules    HPI Susan Case is a 68 y.o. female with PMH significant for HLD who presents for initial evaluation of abnormal chest imaging.  Discussed the use of AI scribe software for clinical note transcription with the patient, who gave verbal consent to proceed. History of Present Illness Susan Case is a 68 year old female who presents for pulmonary evaluation of lung nodules. She is accompanied by her daughter. She was referred by her primary doctor for evaluation of lung nodules found on a CT scan.  Initially, she experienced severe nausea, vomiting, and diarrhea, leading to a hospital admission for hyponatremia. After discharge, she reported persistent dizziness, disorientation, and fogginess in the head and neck. An MRI of the brain was normal, but a CT scan of the chest revealed two nodules in the right lung, prompting referral to a pulmonary specialist.  She has no history of smoking and no significant exposure to secondhand smoke. She has a history of exposure to mold in her garage, which she cleaned up in June. Following this exposure, she developed hoarseness and a sensation of needing to clear her throat. She was treated with nasal sprays, prednisone, and Augmentin, which improved her symptoms but did not completely resolve them. She noted that Augmentin was completed two weeks ago.  She has experienced weight loss, dropping from 93 pounds in March to 89 pounds during her illness, but has since regained some weight. She skips meals and does not eat much, which may have contributed to her weight loss.  Her past medical history includes osteoporosis of the spine with multiple vertebral fractures and previous kyphoplasty procedures. She takes a cholesterol  medication, vitamin D, vitamin C, calcium , and a prescription multivitamin.  Family history is significant for ovarian cancer in her mother and heart problems in her father. She lives alone and has a dog. She worked in the school system and part-time at [employer], with no known exposure to asbestos or other occupational hazards.  No cough, shortness of breath, fever, chills, night sweats, or phlegm production. Reports dizziness, disorientation, and fogginess in the head and neck. Experienced hoarseness and throat clearing sensation after mold exposure.  Symptoms Associated with Lung cancer:   Sx of Metastasis: Weight loss   Hx of anesthesia reactions: none  Important Medications:  no blood thinners  Allergies: reviewed in chart  Social History:  Smoking and [employer] Exposure: Tobacco Smoking none  Occupational Exposures: no significant exposure to asbestos  ASA grade:  ASA 2 - Patient with mild systemic disease with no functional limitations  Karnofsky Performance Status: 100 Normal; no complaints  ECOG Performance Status: (0) Fully active, able to carry on all predisease performance without restriction  ROS  Allergies: Codeine  Current Outpatient Medications:    alendronate (FOSAMAX) 70 MG tablet, Take 70 mg by mouth once a week., Disp: , Rfl:    ascorbic acid (VITAMIN C) 500 MG tablet, Take 500 mg by mouth daily., Disp: , Rfl:    calcium  carbonate (OSCAL) 1500 (600 Ca) MG TABS tablet, Take 600 mg of elemental calcium  by mouth in the morning and at bedtime., Disp: , Rfl:    Cholecalciferol (NATURAL VITAMIN D-3) 125 MCG (5000 UT) TABS, Take 5,000 Units  by mouth in the morning., Disp: , Rfl:    feeding supplement (ENSURE PLUS HIGH PROTEIN) LIQD, Take 237 mLs by mouth 3 (three) times daily between meals., Disp: 21330 mL, Rfl: 1   Multiple Vitamin (MULTIVITAMIN WITH MINERALS) TABS tablet, Take 1 tablet by mouth daily., Disp: 90 tablet, Rfl: 1   ondansetron  (ZOFRAN -ODT) 4  MG disintegrating tablet, Take 4 mg by mouth 2 (two) times daily as needed for nausea or vomiting., Disp: , Rfl:    pantoprazole  (PROTONIX ) 40 MG tablet, Take 40 mg by mouth 2 (two) times daily., Disp: , Rfl:    pregabalin  (LYRICA ) 150 MG capsule, Take 150 mg by mouth daily., Disp: , Rfl:    rosuvastatin  (CRESTOR ) 5 MG tablet, Take 5 mg by mouth in the morning., Disp: , Rfl:    temazepam  (RESTORIL ) 30 MG capsule, Take 30 mg by mouth at bedtime., Disp: , Rfl:  Past Medical History:  Diagnosis Date   Arthritis    osteoporosis   Hyperlipemia    Osteoporosis    Past Surgical History:  Procedure Laterality Date   KYPHOPLASTY N/A 06/11/2014   Procedure: KYPHOPLASTY;  Surgeon: Oneil Priestly, MD;  Location: [facility] OR;  Service: Orthopedics;  Laterality: N/A;  T10 kyphoplasty   KYPHOPLASTY N/A 10/05/2017   Procedure: T8 AND T11 KYPHOPLASTY;  Surgeon: Priestly Oneil, MD;  Location: [facility] OR;  Service: Orthopedics;  Laterality: N/A;   KYPHOPLASTY N/A 05/06/2020   Procedure: LUMBAR KYPHOPLASTY;  Surgeon: Priestly Oneil, MD;  Location: [facility] OR;  Service: Orthopedics;  Laterality: N/A;   History reviewed. No pertinent family history. Social History   Socioeconomic History   Marital status: Divorced    Spouse name: Not on file   Number of children: Not on file   Years of education: Not on file   Highest education level: Not on file  Occupational History   Not on file  Tobacco Use   Smoking status: Never   Smokeless tobacco: Never  Vaping Use   Vaping status: Never Used  Substance and Sexual Activity   Alcohol  use: Not Currently   Drug use: No   Sexual activity: Not on file  Other Topics Concern   Not on file  Social History Narrative   Not on file   Social Drivers of Health   Financial Resource Strain: Not on file  Food Insecurity: No Food Insecurity (09/16/2023)   Hunger Vital Sign    Worried About Running Out of Food in the Last Year: Never true    Ran Out of Food in the Last Year: Never true   Transportation Needs: No Transportation Needs (09/16/2023)   PRAPARE - Administrator, Civil Service (Medical): No    Lack of Transportation (Non-Medical): No  Physical Activity: Not on file  Stress: Not on file  Social Connections: Moderately Integrated (09/16/2023)   Social Connection and Isolation Panel    Frequency of Communication with Friends and Family: More than three times a week    Frequency of Social Gatherings with Friends and Family: More than three times a week    Attends Religious Services: More than 4 times per year    Active Member of [employer] or Organizations: Patient declined    Attends Engineer, Structural: More than 4 times per year    Marital Status: Never married  Intimate Partner Violence: Patient Declined (09/16/2023)   Humiliation, Afraid, Rape, and Kick questionnaire    Fear of Current or Ex-Partner: Patient declined    Emotionally  Abused: Patient declined    Physically Abused: Patient declined    Sexually Abused: Patient declined       Objective:  BP (!) 140/91   Pulse 89   Ht 4' 11 (1.499 m)   Wt 93 lb 11.2 oz (42.5 kg)   SpO2 97%   BMI 18.93 kg/m  Wt Readings from Last 3 Encounters:  11/06/23 93 lb 11.2 oz (42.5 kg)  09/16/23 89 lb (40.4 kg)  06/03/20 88 lb (39.9 kg)   BMI Readings from Last 3 Encounters:  11/06/23 18.93 kg/m  09/16/23 17.98 kg/m  06/03/20 17.77 kg/m   SpO2 Readings from Last 3 Encounters:  11/06/23 97%  09/19/23 100%  05/06/20 99%   Physical Exam Physical Exam MEASUREMENTS: Weight- 93 lbs. CHEST: Lungs with mild wheeze, otherwise clear to auscultation. CARDIOVASCULAR: Heart sounds normal.  Diagnostic Review:  Last CBC Lab Results  Component Value Date   WBC 6.7 09/17/2023   HGB 13.3 09/17/2023   HCT 35.9 (L) 09/17/2023   MCV 88.2 09/17/2023   MCH 32.7 09/17/2023   RDW 12.2 09/17/2023   PLT 254 09/17/2023   Last metabolic panel Lab Results  Component Value Date   GLUCOSE 94 09/19/2023    NA 135 09/19/2023   K 3.8 09/19/2023   CL 100 09/19/2023   CO2 26 09/19/2023   BUN 7 (L) 09/19/2023   CREATININE 0.53 09/19/2023   GFRNONAA >60 09/19/2023   CALCIUM  9.0 09/19/2023   PROT 6.8 09/16/2023   ALBUMIN 3.9 09/16/2023   BILITOT 1.4 (H) 09/16/2023   ALKPHOS 45 09/16/2023   AST 18 09/16/2023   ALT 14 09/16/2023   ANIONGAP 9 09/19/2023   CT chest 10/10/2023: RUL subpleural < 6 mm solitary pulmonary nodule and LLL linear consolidation. IMPRESSION: 1. Hyperinflated, with cylindrical bronchiectasis in the lower lobes and infrahilar right middle lobe. 2. Left lower lobe posterior base bandlike consolidation which could be due to atelectasis or infectious process. 3. Aortic and coronary artery atherosclerosis. 4. Minimal pericardial effusion. 5. Multiple thoracic and lumbar compression fractures, some previously treated with kyphoplasty. 6. Chronic healed proximal sternal fracture deformity. Osteopenia. Degenerative change.    Assessment & Plan:   Assessment & Plan Abnormal CT of the chest Plan per below.  Assessment and Plan Assessment & Plan Pulmonary nodules, right lung (6mm middle lobe, 4mm upper lobe) Two small nodules in the right lung, likely benign with low suspicion for malignancy due to size and non-smoking history. Possible mold exposure considered. Per Fleischner criteria, these nodules will need a follow up scan in 1 year in a high risk patient. She is high risk due to age. I will obtain CT chest in 2 months to ensure resolution of LLL consolidation and then annually after that. - Order follow-up chest CT in 2 months to reassess nodules and ensure resolution of pneumonia. - Schedule annual chest CT if nodules remain stable.  Pneumonia, left lower lobe (recent, treated) Recent left lower lobe pneumonia, likely mold-related, treated with Augmentin. Symptoms improved. - Monitor for resolution of pneumonia on follow-up chest CT in 2 months.  Hyponatremia  (recent, resolved) Severe hyponatremia episode resolved post-hospitalization. Possibly related to pneumonia. - Encourage dietary salt intake to maintain sodium levels.  Chronic laryngopharyngeal symptoms (hoarseness, throat clearing) Persistent symptoms possibly due to mold exposure or GERD, improved with treatment. - Continue current management with nasal sprays and monitor symptoms.  Gastroesophageal reflux disease (GERD) GERD suspected to contribute to laryngopharyngeal symptoms, managed with Prilosec. - Continue Prilosec twice  daily.  Bronchiectasis (asymptomatic, radiographic finding) Asymptomatic bronchiectasis noted on CT. - No treatment necessary unless symptoms develop. - Obtain PFTs.  Orders Placed This Encounter  Procedures   CT Chest Wo Contrast   Pulmonary function test   I spent 45 minutes reviewing patient's chart including prior consultant notes, imaging, and PFTs as well as face-to-face with the patient, over half in discussion of the diagnosis and the importance of compliance with the treatment plan.  Return in about 2 months (around 01/16/2024).   Nesbit Michon, MD "

## 2023-11-06 ENCOUNTER — Encounter (HOSPITAL_BASED_OUTPATIENT_CLINIC_OR_DEPARTMENT_OTHER): Payer: Self-pay | Admitting: Pulmonary Disease

## 2023-11-06 ENCOUNTER — Ambulatory Visit (INDEPENDENT_AMBULATORY_CARE_PROVIDER_SITE_OTHER): Admitting: Pulmonary Disease

## 2023-11-06 VITALS — BP 140/91 | HR 89 | Ht 59.0 in | Wt 93.7 lb

## 2023-11-06 DIAGNOSIS — R9389 Abnormal findings on diagnostic imaging of other specified body structures: Secondary | ICD-10-CM | POA: Diagnosis not present

## 2023-11-06 NOTE — Patient Instructions (Signed)
-   Please get breathing test and CT chest before next visit.

## 2023-11-07 DIAGNOSIS — K219 Gastro-esophageal reflux disease without esophagitis: Secondary | ICD-10-CM | POA: Diagnosis not present

## 2023-11-07 DIAGNOSIS — R490 Dysphonia: Secondary | ICD-10-CM | POA: Diagnosis not present

## 2023-11-21 ENCOUNTER — Encounter: Payer: Self-pay | Admitting: Family Medicine

## 2023-11-21 ENCOUNTER — Other Ambulatory Visit: Payer: Self-pay | Admitting: Family Medicine

## 2023-11-21 DIAGNOSIS — M5030 Other cervical disc degeneration, unspecified cervical region: Secondary | ICD-10-CM

## 2023-12-13 DIAGNOSIS — H16223 Keratoconjunctivitis sicca, not specified as Sjogren's, bilateral: Secondary | ICD-10-CM | POA: Diagnosis not present

## 2023-12-13 DIAGNOSIS — Z961 Presence of intraocular lens: Secondary | ICD-10-CM | POA: Diagnosis not present

## 2023-12-18 ENCOUNTER — Ambulatory Visit
Admission: RE | Admit: 2023-12-18 | Discharge: 2023-12-18 | Disposition: A | Discharge status: Home / Self Care | Source: Ambulatory Visit | Attending: Pulmonary Disease | Admitting: Pulmonary Disease

## 2023-12-18 DIAGNOSIS — R911 Solitary pulmonary nodule: Secondary | ICD-10-CM | POA: Diagnosis not present

## 2023-12-18 DIAGNOSIS — R9389 Abnormal findings on diagnostic imaging of other specified body structures: Secondary | ICD-10-CM

## 2023-12-21 ENCOUNTER — Telehealth (HOSPITAL_BASED_OUTPATIENT_CLINIC_OR_DEPARTMENT_OTHER): Payer: Self-pay

## 2023-12-21 ENCOUNTER — Ambulatory Visit: Payer: Self-pay | Admitting: Pulmonary Disease

## 2023-12-21 NOTE — Telephone Encounter (Signed)
 Copied from CRM #[id number]. Topic: Appointments - Scheduling Inquiry for Clinic >> Dec 21, 2023 11:22 AM Susan Case wrote: Reason for CRM: Patient got a VM from Dr. DELENA is wondering if she should still have her PFT/follow-up on 12/29 since Dr. DELENA has sent a message saying that her lungs looked clear/normal. Should she schedule a 52mo follow-up instead and disregard the PFT. Please call Susan Case to advise.

## 2023-12-21 NOTE — Telephone Encounter (Signed)
 Yes, can reschedule PFT and follow up to 6 months.

## 2023-12-21 NOTE — Telephone Encounter (Signed)
Called patient and left voice message

## 2023-12-21 NOTE — Telephone Encounter (Signed)
 Copied from CRM [id number]. Topic: Clinical - Lab/Test Results >> Dec 21, 2023  8:53 AM Susan Case wrote: Reason for CRM: Pt would like to know if her esophagus is still open, pt stated she sees her PCP on tomorrow and would like to know if it is still open so her PCP can order more testing.

## 2023-12-22 ENCOUNTER — Other Ambulatory Visit (HOSPITAL_COMMUNITY): Payer: Self-pay | Admitting: *Deleted

## 2023-12-22 DIAGNOSIS — R519 Headache, unspecified: Secondary | ICD-10-CM | POA: Diagnosis not present

## 2023-12-22 DIAGNOSIS — K5900 Constipation, unspecified: Secondary | ICD-10-CM | POA: Diagnosis not present

## 2023-12-22 DIAGNOSIS — M542 Cervicalgia: Secondary | ICD-10-CM | POA: Diagnosis not present

## 2023-12-22 DIAGNOSIS — R1312 Dysphagia, oropharyngeal phase: Secondary | ICD-10-CM | POA: Diagnosis not present

## 2023-12-22 DIAGNOSIS — R1310 Dysphagia, unspecified: Secondary | ICD-10-CM

## 2023-12-22 DIAGNOSIS — E871 Hypo-osmolality and hyponatremia: Secondary | ICD-10-CM | POA: Diagnosis not present

## 2023-12-22 DIAGNOSIS — R911 Solitary pulmonary nodule: Secondary | ICD-10-CM | POA: Diagnosis not present

## 2023-12-22 DIAGNOSIS — E785 Hyperlipidemia, unspecified: Secondary | ICD-10-CM | POA: Diagnosis not present

## 2023-12-22 DIAGNOSIS — M5030 Other cervical disc degeneration, unspecified cervical region: Secondary | ICD-10-CM | POA: Diagnosis not present

## 2023-12-22 DIAGNOSIS — R682 Dry mouth, unspecified: Secondary | ICD-10-CM | POA: Diagnosis not present

## 2023-12-22 DIAGNOSIS — K219 Gastro-esophageal reflux disease without esophagitis: Secondary | ICD-10-CM | POA: Diagnosis not present

## 2023-12-22 DIAGNOSIS — E43 Unspecified severe protein-calorie malnutrition: Secondary | ICD-10-CM | POA: Diagnosis not present

## 2024-01-01 ENCOUNTER — Other Ambulatory Visit (HOSPITAL_BASED_OUTPATIENT_CLINIC_OR_DEPARTMENT_OTHER): Payer: Self-pay

## 2024-01-10 ENCOUNTER — Ambulatory Visit (HOSPITAL_COMMUNITY)
Admission: RE | Admit: 2024-01-10 | Discharge: 2024-01-10 | Disposition: A | Discharge status: Home / Self Care | Source: Ambulatory Visit | Attending: Internal Medicine | Admitting: Internal Medicine

## 2024-01-10 DIAGNOSIS — K117 Disturbances of salivary secretion: Secondary | ICD-10-CM | POA: Insufficient documentation

## 2024-01-10 DIAGNOSIS — R1312 Dysphagia, oropharyngeal phase: Secondary | ICD-10-CM | POA: Insufficient documentation

## 2024-01-10 DIAGNOSIS — R1310 Dysphagia, unspecified: Secondary | ICD-10-CM

## 2024-01-10 NOTE — Therapy (Signed)
 Modified Barium Swallow Study  Patient Details  Name: Susan Case MRN: [id number] Date of Birth: 1955-02-27  Today's Date: 01/10/2024  Modified Barium Swallow completed.  Full report located under Chart Review in the Imaging Section.  History of Present Illness 68 y.o. female who presented to this OP MBS secondary to her concerns of globus sensation with solids, CT scan of chest showing that my esophagus is open, heartburn and persistent dry mouth secondary to inadequate saliva production. Patient's symptoms have been present since this past June, 2025. PMH: kyphoplasty in 2016, 2019, 2022, osteoporosis, right upper lobe pulmonary nodule that is stable per 12/18/23 CT chest, GERD.   Clinical Impression Patient presents with a mild oropharyngeal dysphagia as per this MBS but with her symptoms best explained by an esophageal dyspahgia. No aspiration or penetration with any of the tested barium consistencies. During esophageal sweep, slow motility of barium observed. SLP recommending to consider GI consult. DIGEST Swallow Severity Rating*  Safety: 0  Efficiency:0  Overall Pharyngeal Swallow Severity: 0 1: mild; 2: moderate; 3: severe; 4: profound  *The Dynamic Imaging Grade of Swallowing Toxicity is standardized for the head and neck cancer population, however, demonstrates promising clinical applications across populations to standardize the clinical rating of pharyngeal swallow safety and severity.    Swallow Evaluation Recommendations Recommendations: PO diet PO Diet Recommendation: Regular;Thin liquids (Level 0) Liquid Administration via: Cup;Straw Medication Administration: Whole meds with puree Supervision: Patient able to self-feed Postural changes: Stay upright 30-60 min after meals;Position pt fully upright for meals Recommended consults: Consider GI consultation      Norleen IVAR Blase, MA, CCC-SLP Speech Therapy  01/10/2024,1:33 PM

## 2024-01-18 ENCOUNTER — Encounter: Payer: Self-pay | Admitting: Gastroenterology

## 2024-01-29 ENCOUNTER — Encounter (HOSPITAL_BASED_OUTPATIENT_CLINIC_OR_DEPARTMENT_OTHER)

## 2024-01-29 ENCOUNTER — Ambulatory Visit (HOSPITAL_BASED_OUTPATIENT_CLINIC_OR_DEPARTMENT_OTHER): Admitting: Pulmonary Disease

## 2024-03-06 ENCOUNTER — Ambulatory Visit: Admitting: Gastroenterology

## 2024-03-06 ENCOUNTER — Encounter: Payer: Self-pay | Admitting: Gastroenterology

## 2024-03-06 VITALS — BP 122/70 | HR 110 | Ht 59.0 in | Wt 92.0 lb

## 2024-03-06 DIAGNOSIS — R1310 Dysphagia, unspecified: Secondary | ICD-10-CM

## 2024-03-06 DIAGNOSIS — K581 Irritable bowel syndrome with constipation: Secondary | ICD-10-CM

## 2024-03-06 DIAGNOSIS — K219 Gastro-esophageal reflux disease without esophagitis: Secondary | ICD-10-CM

## 2024-03-06 MED ORDER — LUBIPROSTONE 24 MCG PO CAPS: 24.0000 ug | ORAL_CAPSULE | Freq: Two times a day (BID) | ORAL | 3 refills | Status: AC

## 2024-03-06 NOTE — Patient Instructions (Addendum)
 VISIT SUMMARY:  During your visit, we discussed your ongoing issues with chronic nausea, vomiting, dysphagia, weight loss, GERD, constipation, and dry mouth. We have planned further evaluations and adjustments to your treatment to help manage these symptoms.  YOUR PLAN:  CHRONIC NAUSEA, VOMITING, AND DYSPHAGIA WITH WEIGHT LOSS: You have persistent nausea, vomiting, and difficulty swallowing, which has led to weight loss and poor oral intake. -You are scheduled for an upper endoscopy on March 13th to check for any structural issues in your esophagus, stomach, and small bowel. -Eat small, frequent meals and snacks every 2-3 hours, including upon waking, to help with nausea and promote weight gain. -You can eat any foods you tolerate, including pasta with red sauce, milkshakes, and protein shakes. -Drink at least eight cups of water daily. -Try the provided samples of digestive enzymes (FODZYME) to see if they help with your symptoms. -Use Zofran  as needed for severe nausea and try candied ginger or hard candies for mild nausea. -We will consider a barium esophagram if the upper endoscopy does not provide answers and your symptoms continue. -Do not skip meals despite nausea and monitor which foods you can tolerate. -There is a low likelihood of cancer based on your previous imaging and blood work, but we need to continue with further evaluations. -You may have a mild sore throat after the endoscopy.  GASTROESOPHAGEAL REFLUX DISEASE (GERD): You have chronic GERD symptoms that are partially controlled with pantoprazole . -Continue taking pantoprazole . -You do not need to follow strict dietary restrictions for reflux; eat based on what you can tolerate. -Avoid excessive alcohol , caffeine, and soda, but moderate intake is allowed if you can tolerate it.  CONSTIPATION: You have chronic constipation that requires daily laxative use. -Take Amitiza  24 mcg twice daily. -Stop taking Miralax while trying  Amitiza , but you can use Miralax if you need additional stool softening. -Adjust your laxative use to achieve soft, regular stools without diarrhea.  DRY MOUTH (XEROSTOMIA): You have chronic dry mouth and parched lips, likely due to poor oral intake and chronic nausea. -Drink water frequently to help with dry mouth. -Try the digestive enzymes (FODZYME) to see if they help with your symptoms. -Eat and drink regularly to improve oral moisture.  You have been scheduled for an endoscopy. Please follow written instructions given to you at your visit today.  If you use inhalers (even only as needed), please bring them with you on the day of your procedure.  If you take any of the following medications, they will need to be adjusted prior to your procedure:   DO NOT TAKE 7 DAYS PRIOR TO TEST- Trulicity (dulaglutide) Ozempic, Wegovy (semaglutide) Mounjaro, Zepbound (tirzepatide) Bydureon Bcise (exanatide extended release)  DO NOT TAKE 1 DAY PRIOR TO YOUR TEST Rybelsus (semaglutide) Adlyxin (lixisenatide) Victoza (liraglutide) Byetta (exanatide) ___________________________________________________________________________    Due to recent changes in healthcare laws, you may see the results of your imaging and laboratory studies on MyChart before your provider has had a chance to review them.  We understand that in some cases there may be results that are confusing or concerning to you. Not all laboratory results come back in the same time frame and the provider may be waiting for multiple results in order to interpret others.  Please give us  48 hours in order for your provider to thoroughly review all the results before contacting the office for clarification of your results.    I appreciate the  opportunity to care for you  Thank You  Kavitha Nandigam , MD

## 2024-03-06 NOTE — Progress Notes (Unsigned)
 "                Susan Case    [id number]    1955/05/28  Primary Care Physician:Avva, Searcy, MD  Referring Physician: Janey Searcy, MD [address] [place],  [place] [address]   Chief complaint: Dysphagia, globus sensation  Discussed the use of AI scribe software for clinical note transcription with the patient, who gave verbal consent to proceed.  History of Present Illness Susan Case is a 69 year old female with severe osteoporosis and prior kyphoplasties who presents for evaluation of chronic dysphagia.  Dysphagia and Oropharyngeal Symptoms: - Progressive dysphagia since July 27, 2023 - Initial symptoms included hoarseness and persistent globus sensation without sore throat - No prior history of dysphagia - Initial treatment for presumed allergic reaction and steroid spray were ineffective - Partial benefit with pantoprazole  for presumed GERD - Follows a reflux diet, avoids caffeine, coffee, and alcohol ; occasionally consumes 1-2 diet sodas daily, which provide some relief for nausea - Able to tolerate soft foods (applesauce, Jell-O), but has difficulty with solid foods - Intake limited by persistent nausea; snacks throughout the day to maintain nutrition - ENT evaluation included negative SSA/SSB serologies and laryngoscopy limited to the larynx  Nausea, Vomiting, and Weight Loss: - Persistent nausea present most days, sometimes lasting all day - Severe episode in August 2025 with nausea, vomiting, and diarrhea for two days, treated with Zofran , but symptoms persisted - Hospitalized for severe hyponatremia (serum sodium 116) during this episode; sodium corrected during hospitalization - Zofran  used as needed, typically twice daily on severe days - Diet soda provides some relief for nausea - Sometimes tolerates protein shakes or milkshakes - Weight decreased by approximately 5 pounds over 6 months, fluctuating between 89 and 92 pounds - 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, requiring rest at home on those days  Xerostomia (Dry Mouth): - Persistent dry mouth and parched lips since hospitalization in August 2025 - No improvement with multiple over-the-counter Betaine products (toothpaste, mouthwash) - No improvement in oral dryness after ENT evaluation  Constipation: - Requires daily or near-daily Miralax for bowel movements - Difficulty with spontaneous bowel movements when Miralax is stopped - With Miralax, has several bowel movements per day, with consistently light brown to yellow, thin-caliber stool - No prior colonoscopy; completed Cologuard testing - Expresses concern about family history of cancer and her age  Relevant Medical History: - Severe osteoporosis with three prior kyphoplasties (T8, T10/T11, L2), most recent in 2022 - Describes spine as brittle - History of severe hyponatremia (serum sodium 116) in August 2025 - Brain MRI and chest CT during hospitalization showed left lower lobe pneumonia and right lung nodules; follow-up imaging showed resolution of pneumonia and patent esophagus - Referred to pulmonology and ENT for further evaluation  Functional Status and Social Factors: - Retired, works part-time as an Psychologist, 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 Stable 3-4 mm right upper lobe solid pulmonary nodule; for patients >66 years old without cancer or immunocompromise, no routine follow-up imaging is recommended per Fleischner Society Guidelines 2. Moderate multivessel coronary artery calcification and mild thoracic aortic atherosclerosis; no thoracic aortic aneurysm 3. Diffuse osteopenia; vertebroplasty at T8, T10, and T11; age-indeterminate compression  deformities at T1, T4, T6, T9, and T12 without retropulsion  CT Chest 10/10/23 1. Hyperinflated, with cylindrical bronchiectasis in the lower lobes and infrahilar right middle lobe. 2. Left lower lobe posterior base bandlike consolidation which could be due to atelectasis or infectious process. 3. Aortic and coronary artery atherosclerosis. 4. Minimal pericardial effusion. 5. Multiple thoracic and lumbar compression fractures, some previously treated with kyphoplasty. 6. Chronic healed proximal sternal fracture deformity. Osteopenia. Degenerative change.  Outpatient Encounter Medications as of 03/06/2024  Medication Sig   alendronate (FOSAMAX) 70 MG tablet Take 70 mg by mouth once a week.   ascorbic acid (VITAMIN C) 500 MG tablet Take 500 mg by mouth daily.   calcium  carbonate (OSCAL) 1500 (600 Ca) MG TABS tablet Take 600 mg of elemental calcium  by mouth in the morning and at bedtime.   Cholecalciferol (NATURAL VITAMIN D-3) 125 MCG (5000 UT) TABS Take 5,000 Units by mouth in the morning.   feeding supplement (ENSURE PLUS HIGH PROTEIN) LIQD Take 237 mLs by mouth 3 (three) times daily between meals.   Multiple Vitamin (MULTIVITAMIN WITH MINERALS) TABS tablet Take 1 tablet by mouth daily.   ondansetron  (ZOFRAN -ODT) 4 MG disintegrating tablet Take 4 mg by mouth 2 (two) times daily as needed for nausea or vomiting.   pantoprazole  (PROTONIX ) 40 MG tablet Take 40 mg by mouth 2 (two) times daily.   pregabalin  (LYRICA ) 150 MG capsule Take 150 mg by mouth daily.   rosuvastatin  (CRESTOR ) 5 MG tablet Take 5 mg by mouth in the morning.   temazepam  (RESTORIL ) 30 MG capsule Take 30 mg by mouth at bedtime.   No facility-administered encounter medications on file as of 03/06/2024.    Allergies as of 03/06/2024 - Review Complete 11/06/2023  Allergen Reaction Noted   Codeine Itching and Nausea Only 09/28/2017    Past Medical History:  Diagnosis Date   Arthritis    osteoporosis   Hyperlipemia     Osteoporosis     Past Surgical History:  Procedure Laterality Date   KYPHOPLASTY N/A 06/11/2014   Procedure: KYPHOPLASTY;  Surgeon: Oneil Priestly, MD;  Location: [facility] OR;  Service: Orthopedics;  Laterality: N/A;  T10 kyphoplasty   KYPHOPLASTY N/A 10/05/2017   Procedure: T8 AND T11 KYPHOPLASTY;  Surgeon: Priestly Oneil, MD;  Location: [facility] OR;  Service: Orthopedics;  Laterality: N/A;   KYPHOPLASTY N/A 05/06/2020   Procedure: LUMBAR KYPHOPLASTY;  Surgeon: Priestly Oneil, MD;  Location: [facility] OR;  Service: Orthopedics;  Laterality: N/A;    No family history on file.  Social History   Socioeconomic History   Marital status: Divorced    Spouse name: Not on file   Number of children: Not on file   Years of education: Not on file   Highest education level: Not on file  Occupational History   Not on file  Tobacco Use   Smoking status: Never   Smokeless tobacco: Never  Vaping Use   Vaping status: Never Used  Substance and Sexual Activity   Alcohol  use: Not Currently   Drug use: No   Sexual activity: Not on file  Other Topics Concern   Not on file  Social History Narrative   Not on file   Social Drivers of Health   Tobacco Use: Low Risk (11/07/2023)   Received from [facility]   Patient History    Smoking Tobacco Use: Never    Smokeless Tobacco Use: Never    Passive Exposure: Never  Financial Resource Strain: Not on file  Food  Insecurity: No Food Insecurity (09/16/2023)   Epic    Worried About Programme Researcher, Broadcasting/film/video in the Last Year: Never true    Ran Out of Food in the Last Year: Never true  Transportation Needs: No Transportation Needs (09/16/2023)   Epic    Lack of Transportation (Medical): No    Lack of Transportation (Non-Medical): No  Physical Activity: Not on file  Stress: Not on file  Social Connections: Moderately Integrated (09/16/2023)   Social Connection and Isolation Panel    Frequency of Communication with Friends and Family: More than three times a week    Frequency  of Social Gatherings with Friends and Family: More than three times a week    Attends Religious Services: More than 4 times per year    Active Member of [employer] or Organizations: Patient declined    Attends Engineer, Structural: More than 4 times per year    Marital Status: Never married  Intimate Partner Violence: Patient Declined (09/16/2023)   Epic    Fear of Current or Ex-Partner: Patient declined    Emotionally Abused: Patient declined    Physically Abused: Patient declined    Sexually Abused: Patient declined  Depression (PHQ2-9): Not on file  Alcohol  Screen: Not on file  Housing: Low Risk (09/16/2023)   Epic    Unable to Pay for Housing in the Last Year: No    Number of Times Moved in the Last Year: 0    Homeless in the Last Year: No  Utilities: Not At Risk (09/16/2023)   Epic    Threatened with loss of utilities: No  Health Literacy: Not on file      Review of systems: All other review of systems negative except as mentioned in the HPI.   Physical Exam: Vitals:   03/06/24 1314  BP: 122/70  Pulse: (!) 110   Body mass index is 18.58 kg/m. Gen:      No acute distress HEENT:  sclera anicteric CV: s1s2 rrr, no murmur Lungs: B/l clear. Abd:      soft, non-tender; no palpable masses, no distension Ext:    No edema Neuro: alert and oriented x 3 Psych: normal mood and affect  Data Reviewed:  Reviewed labs, radiology imaging, old records and pertinent past GI work up    Assessment & Plan Chronic nausea, vomiting, and dysphagia with weight loss She continues to experience persistent, severe, and refractory nausea, vomiting, and dysphagia with associated weight loss and impaired oral intake. Prior imaging and serologies have been negative, with low suspicion for malignancy, but further evaluation is warranted to exclude serious pathology. - Scheduled upper endoscopy for March 13th to assess for structural abnormalities, ulcers, or strictures in the esophagus,  stomach, and proximal small bowel. - Recommended small, frequent meals and snacks every 2-3 hours, including upon waking, to mitigate nausea and promote weight gain. - Advised liberalization of diet, permitting any tolerated foods, including pasta with red sauce, milkshakes, and protein shakes, rather than strict adherence to reflux diet. - Encouraged intake of at least eight cups of water daily. - Provided samples of digestive enzymes (Farxime) for symptomatic trial. - Recommended Zofran  as needed for severe nausea; suggested candied ginger or hard candies for mild nausea. - Deferred colonoscopy until swallowing and nutritional status improve, due to concerns regarding bowel prep tolerance and risk of exacerbating nausea. - Planned barium esophagram if upper endoscopy is non-diagnostic and symptoms persist. - Advised against skipping meals despite nausea and to monitor individual  food tolerance. - Discussed low likelihood of malignancy given negative imaging and blood work, but emphasized need for further diagnostic evaluation. - Provided anticipatory guidance regarding possible mild sore throat post-endoscopy.  Gastroesophageal reflux disease (GERD) She has chronic, partially controlled GERD symptoms on pantoprazole , which may contribute to dysphagia and nausea, but has shown limited response to acid suppression. - Continued pantoprazole  therapy. - Advised that strict dietary restrictions for reflux are not necessary; recommended liberalizing diet based on tolerance. - Provided guidance to avoid excessive alcohol , caffeine, and soda, but permitted moderate intake if tolerated.  Constipation She has chronic, persistent constipation requiring daily laxative use, likely exacerbated by poor oral intake and altered gastrointestinal transit, contributing to overall gastrointestinal symptoms. - Prescribed Amitiza  24 mcg twice daily. - Advised discontinuation of Miralax while trialing Amitiza , but  permitted Miralax as needed for additional stool softening. - Instructed to titrate laxative therapy to achieve soft, regular stools without diarrhea.  Dry mouth (xerostomia) She experiences chronic xerostomia and parched lips, likely multifactorial and related to poor oral intake and chronic nausea; Sjogren's syndrome has been ruled out. - Encouraged frequent oral intake and hydration to alleviate xerostomia. - Advised trial of digestive enzymes (Farxime) for possible symptomatic benefit. - Reinforced importance of regular eating and drinking to improve oral moisture.        This visit required 60 minutes of patient care (this includes precharting, chart review, review of results, face-to-face time used for counseling as well as treatment plan and follow-up. The patient was provided an opportunity to ask questions and all were answered. The patient agreed with the plan and demonstrated an understanding of the instructions.  Susan Case , MD    CC: Avva, Ravisankar, MD    "

## 2024-03-08 ENCOUNTER — Encounter: Payer: Self-pay | Admitting: Gastroenterology

## 2024-04-12 ENCOUNTER — Encounter: Admitting: Gastroenterology
